# Patient Record
Sex: Female | Born: 1965 | Race: White | Hispanic: No | Marital: Single | Smoking: Never smoker
Health system: Southern US, Community
[De-identification: ages and names within clinical notes are randomized; demographics above are authoritative.]

## PROBLEM LIST (undated history)

## (undated) DIAGNOSIS — G43909 Migraine, unspecified, not intractable, without status migrainosus: Secondary | ICD-10-CM

## (undated) DIAGNOSIS — I10 Essential (primary) hypertension: Secondary | ICD-10-CM

## (undated) HISTORY — PX: APPENDECTOMY: SHX54

## (undated) HISTORY — DX: Essential (primary) hypertension: I10

## (undated) HISTORY — DX: Migraine, unspecified, not intractable, without status migrainosus: G43.909

---

## 2014-04-30 LAB — HM MAMMOGRAPHY: HM MAMMO: NORMAL

## 2014-06-07 DIAGNOSIS — D229 Melanocytic nevi, unspecified: Secondary | ICD-10-CM | POA: Insufficient documentation

## 2014-06-07 HISTORY — DX: Melanocytic nevi, unspecified: D22.9

## 2014-08-05 LAB — LIPID PANEL
Cholesterol: 194 mg/dL (ref 0–200)
HDL: 45 mg/dL (ref 35–70)
LDL Cholesterol: 124 mg/dL
Triglycerides: 125 mg/dL (ref 40–160)

## 2014-08-05 LAB — CBC AND DIFFERENTIAL: Hemoglobin: 14 g/dL (ref 12.0–16.0)

## 2014-08-05 LAB — TSH: TSH: 1.6 u[IU]/mL (ref <=5.90)

## 2015-02-15 ENCOUNTER — Other Ambulatory Visit: Payer: Self-pay | Admitting: Internal Medicine

## 2015-02-15 ENCOUNTER — Encounter: Payer: Self-pay | Admitting: Internal Medicine

## 2015-02-15 ENCOUNTER — Ambulatory Visit (INDEPENDENT_AMBULATORY_CARE_PROVIDER_SITE_OTHER): Payer: Commercial Managed Care - PPO | Admitting: Internal Medicine

## 2015-02-15 VITALS — BP 118/64 | HR 80 | Ht 63.0 in | Wt 136.0 lb

## 2015-02-15 DIAGNOSIS — J302 Other seasonal allergic rhinitis: Secondary | ICD-10-CM

## 2015-02-15 DIAGNOSIS — D485 Neoplasm of uncertain behavior of skin: Secondary | ICD-10-CM

## 2015-02-15 DIAGNOSIS — G43009 Migraine without aura, not intractable, without status migrainosus: Secondary | ICD-10-CM | POA: Insufficient documentation

## 2015-02-15 DIAGNOSIS — I10 Essential (primary) hypertension: Secondary | ICD-10-CM | POA: Insufficient documentation

## 2015-02-15 DIAGNOSIS — R0981 Nasal congestion: Secondary | ICD-10-CM | POA: Diagnosis not present

## 2015-02-15 DIAGNOSIS — E785 Hyperlipidemia, unspecified: Secondary | ICD-10-CM | POA: Insufficient documentation

## 2015-02-15 DIAGNOSIS — E782 Mixed hyperlipidemia: Secondary | ICD-10-CM | POA: Insufficient documentation

## 2015-02-15 DIAGNOSIS — J3089 Other allergic rhinitis: Secondary | ICD-10-CM | POA: Insufficient documentation

## 2015-02-15 DIAGNOSIS — L03031 Cellulitis of right toe: Secondary | ICD-10-CM | POA: Diagnosis not present

## 2015-02-15 HISTORY — DX: Neoplasm of uncertain behavior of skin: D48.5

## 2015-02-15 MED ORDER — FLUTICASONE PROPIONATE 50 MCG/ACT NA SUSP: 2.0000 | Freq: Every day | NASAL | Status: AC

## 2015-02-15 MED ORDER — AMOXICILLIN-POT CLAVULANATE 875-125 MG PO TABS: 1.0000 | ORAL_TABLET | Freq: Two times a day (BID) | ORAL | Status: DC

## 2015-02-15 NOTE — Progress Notes (Signed)
Date:  02/15/2015   Name:  Brenda Keith   DOB:  [birth year]   MRN:  [id number]   Chief Complaint: No chief complaint on file. Other This is a new (pain on the lateral right great toe nail) problem. The current episode started in the past 7 days (but had had trouble since last year when trauma caused the nail to come off.). The problem occurs constantly. The problem has been unchanged. Associated symptoms include congestion. Pertinent negatives include no chest pain, coughing, fatigue, headaches, sore throat or swollen glands. The symptoms are aggravated by walking. Treatments tried: soaking and topical antibiotics.  Sinus Problem This is a recurrent problem. The current episode started 1 to 4 weeks ago. The problem has been waxing and waning since onset. There has been no fever. Associated symptoms include congestion, sinus pressure and sneezing. Pertinent negatives include no coughing, ear pain, headaches, hoarse voice, shortness of breath, sore throat or swollen glands. Past treatments include oral decongestants (and antihistamines; used flonase in the past). The treatment provided mild relief.  She denies any new exposure to possible allergens.   Review of Systems:  Review of Systems  Constitutional: Negative for activity change, appetite change and fatigue.  HENT: Positive for congestion, postnasal drip, sinus pressure and sneezing. Negative for ear pain, hoarse voice, sore throat, trouble swallowing and voice change.   Eyes: Negative for visual disturbance.  Respiratory: Negative for cough and shortness of breath.   Cardiovascular: Negative for chest pain.  Musculoskeletal:       Toe pain on right great toe.  Neurological: Negative for headaches.    Patient Active Problem List   Diagnosis Date Noted  . Familial multiple lipoprotein-type hyperlipidemia 02/15/2015  . Atypical migraine 02/15/2015  . Essential (primary) hypertension 02/15/2015  . Neoplasm of uncertain behavior of skin  02/15/2015  . Allergic rhinitis, seasonal 02/15/2015    Prior to Admission medications   Medication Sig Start Date End Date Taking? Authorizing Provider  clindamycin (CLINDAGEL) 1 % gel CLINDAMYCIN PHOSPHATE, 1% (External Gel) - Historical Medication  (1 %) Active   Yes Historical Provider, MD  fluorouracil (EFUDEX) 5 % cream FLUOROURACIL, 5% (External Cream) - Historical Medication  (5 %) Active   Yes Historical Provider, MD  losartan (COZAAR) 50 MG tablet Take 1 tablet by mouth daily. 06/07/14  Yes Historical Provider, MD  norgestrel-ethinyl estradiol (CRYSELLE-28) 0.3-30 MG-MCG tablet Take 1 tablet by mouth daily.   Yes Historical Provider, MD  zolmitriptan (ZOMIG) 5 MG tablet Take 1 tablet by mouth as needed. 06/02/14  Yes Historical Provider, MD    Allergies  Allergen Reactions  . Ace Inhibitors Cough    Past Surgical History  Procedure Laterality Date  . Appendectomy      History  Substance Use Topics  . Smoking status: Never Smoker   . Smokeless tobacco: Not on file  . Alcohol Use: 1.2 oz/week    2 Standard drinks or equivalent per week     Medication list has been reviewed and updated.  Physical Examination:  Physical Exam  Constitutional: She appears well-developed and well-nourished. No distress.  HENT:  Right Ear: Tympanic membrane and ear canal normal.  Left Ear: Tympanic membrane and ear canal normal.  Nose: Nose normal. Right sinus exhibits no maxillary sinus tenderness. Left sinus exhibits no maxillary sinus tenderness.  Mouth/Throat: Uvula is midline and oropharynx is clear and moist.  Neck: Neck supple. No tracheal tenderness present. Carotid bruit is not present.  Cardiovascular: Normal rate, regular rhythm and  S1 normal.   Pulmonary/Chest: Breath sounds normal. She has no wheezes.  Musculoskeletal:  Ingrown toenail on lateral right great toe.  Tender, warm, red and swollen.  No drainage noted.    BP 118/64 mmHg  Pulse 80  Ht 5\' 3"  (1.6 m)  Wt  136 lb (61.689 kg)  BMI 24.10 kg/m2  Assessment and Plan: 1. Sinus congestion Continue sudafed and allegra if needed Begin flonase daily - fluticasone (FLONASE) 50 MCG/ACT nasal spray; Place 2 sprays into both nostrils daily.  Dispense: 16 g; Refill: 6  2. Paronychia of great toe, right Continue soaks Avoid pressure with open shoes until resolved - amoxicillin-clavulanate (AUGMENTIN) 875-125 MG per tablet; Take 1 tablet by mouth 2 (two) times daily.  Dispense: 20 tablet; Refill: 0   Halina Maidens, MD [facility]  02/15/2015

## 2015-06-02 ENCOUNTER — Other Ambulatory Visit: Payer: Self-pay | Admitting: Internal Medicine

## 2015-09-21 ENCOUNTER — Encounter: Payer: Self-pay | Admitting: Internal Medicine

## 2015-09-21 ENCOUNTER — Ambulatory Visit (INDEPENDENT_AMBULATORY_CARE_PROVIDER_SITE_OTHER): Payer: Commercial Managed Care - PPO | Admitting: Internal Medicine

## 2015-09-21 VITALS — BP 110/60 | HR 72 | Ht 63.0 in | Wt 133.0 lb

## 2015-09-21 DIAGNOSIS — I10 Essential (primary) hypertension: Secondary | ICD-10-CM | POA: Diagnosis not present

## 2015-09-21 DIAGNOSIS — G43009 Migraine without aura, not intractable, without status migrainosus: Secondary | ICD-10-CM

## 2015-09-21 DIAGNOSIS — D485 Neoplasm of uncertain behavior of skin: Secondary | ICD-10-CM

## 2015-09-21 DIAGNOSIS — G43809 Other migraine, not intractable, without status migrainosus: Secondary | ICD-10-CM | POA: Diagnosis not present

## 2015-09-21 DIAGNOSIS — E785 Hyperlipidemia, unspecified: Secondary | ICD-10-CM | POA: Diagnosis not present

## 2015-09-21 DIAGNOSIS — Z0000 Encounter for general adult medical examination without abnormal findings: Secondary | ICD-10-CM | POA: Diagnosis not present

## 2015-09-21 MED ORDER — ZOLMITRIPTAN 5 MG PO TABS: 5.0000 mg | ORAL_TABLET | ORAL | Status: DC | PRN

## 2015-09-21 NOTE — Patient Instructions (Signed)
Breast Self-Awareness Practicing breast self-awareness may pick up problems early, prevent significant medical complications, and possibly save your life. By practicing breast self-awareness, you can become familiar with how your breasts look and feel and if your breasts are changing. This allows you to notice changes early. It can also offer you some reassurance that your breast health is good. One way to learn what is normal for your breasts and whether your breasts are changing is to do a breast self-exam. If you find a lump or something that was not present in the past, it is best to contact your caregiver right away. Other findings that should be evaluated by your caregiver include nipple discharge, especially if it is bloody; skin changes or reddening; areas where the skin seems to be pulled in (retracted); or new lumps and bumps. Breast pain is seldom associated with cancer (malignancy), but should also be evaluated by a caregiver. HOW TO PERFORM A BREAST SELF-EXAM The best time to examine your breasts is 5-7 days after your menstrual period is over. During menstruation, the breasts are lumpier, and it may be more difficult to pick up changes. If you do not menstruate, have reached menopause, or had your uterus removed (hysterectomy), you should examine your breasts at regular intervals, such as monthly. If you are breastfeeding, examine your breasts after a feeding or after using a breast pump. Breast implants do not decrease the risk for lumps or tumors, so continue to perform breast self-exams as recommended. Talk to your caregiver about how to determine the difference between the implant and breast tissue. Also, talk about the amount of pressure you should use during the exam. Over time, you will become more familiar with the variations of your breasts and more comfortable with the exam. A breast self-exam requires you to remove all your clothes above the waist. 1. Look at your breasts and nipples.  Stand in front of a mirror in a room with good lighting. With your hands on your hips, push your hands firmly downward. Look for a difference in shape, contour, and size from one breast to the other (asymmetry). Asymmetry includes puckers, dips, or bumps. Also, look for skin changes, such as reddened or scaly areas on the breasts. Look for nipple changes, such as discharge, dimpling, repositioning, or redness. 2. Carefully feel your breasts. This is best done either in the shower or tub while using soapy water or when flat on your back. Place the arm (on the side of the breast you are examining) above your head. Use the pads (not the fingertips) of your three middle fingers on your opposite hand to feel your breasts. Start in the underarm area and use  inch (2 cm) overlapping circles to feel your breast. Use 3 different levels of pressure (light, medium, and firm pressure) at each circle before moving to the next circle. The light pressure is needed to feel the tissue closest to the skin. The medium pressure will help to feel breast tissue a little deeper, while the firm pressure is needed to feel the tissue close to the ribs. Continue the overlapping circles, moving downward over the breast until you feel your ribs below your breast. Then, move one finger-width towards the center of the body. Continue to use the  inch (2 cm) overlapping circles to feel your breast as you move slowly up toward the collar bone (clavicle) near the base of the neck. Continue the up and down exam using all 3 pressures until you reach the   middle of the chest. Do this with each breast, carefully feeling for lumps or changes. 3.  Keep a written record with breast changes or normal findings for each breast. By writing this information down, you do not need to depend only on memory for size, tenderness, or location. Write down where you are in your menstrual cycle, if you are still menstruating. Breast tissue can have some lumps or  thick tissue. However, see your caregiver if you find anything that concerns you.  SEEK MEDICAL CARE IF:  You see a change in shape, contour, or size of your breasts or nipples.   You see skin changes, such as reddened or scaly areas on the breasts or nipples.   You have an unusual discharge from your nipples.   You feel a new lump or unusually thick areas.    This information is not intended to replace advice given to you by your health care provider. Make sure you discuss any questions you have with your health care provider.   Document Released: 07/17/2005 Document Revised: 07/03/2012 Document Reviewed: 11/01/2011 Elsevier Interactive Patient Education 2016 [employer]  

## 2015-09-21 NOTE — Progress Notes (Signed)
Date:  09/21/2015   Name:  Brenda Keith   DOB:  03/04/1966   MRN:  [id number]   Chief Complaint: Annual Exam Brenda Keith is a 50 y.o. female who presents today for her Complete Annual Exam. She feels well. She reports exercising regularly. She is currently training for a half marathon. She reports she is sleeping well. She sees GYN for Pap and mammogram.  She reports having shingles last fall - a mild case but wants to get the vaccine as soon as she turns 50.  Hypertension This is a chronic problem. The current episode started more than 1 year ago. The problem is unchanged. The problem is controlled. Associated symptoms include headaches (rarely). Pertinent negatives include no chest pain, palpitations or shortness of breath. There are no associated agents to hypertension.  Migraine  This is a chronic problem. The problem occurs seasonly (about 3-4 times per year). The pain quality is similar to prior headaches. The quality of the pain is described as aching. Pertinent negatives include no abdominal pain, coughing, dizziness, fever, hearing loss, tinnitus or vomiting. She has tried triptans for the symptoms. The treatment provided significant relief. Her past medical history is significant for hypertension.    Review of Systems  Constitutional: Negative for fever, chills and fatigue.  HENT: Negative for congestion, hearing loss, tinnitus, trouble swallowing and voice change.   Eyes: Negative for visual disturbance.  Respiratory: Negative for cough, chest tightness, shortness of breath and wheezing.   Cardiovascular: Negative for chest pain, palpitations and leg swelling.  Gastrointestinal: Negative for vomiting, abdominal pain, diarrhea and constipation.  Endocrine: Negative for polydipsia and polyuria.  Genitourinary: Negative for dysuria, frequency, vaginal bleeding, vaginal discharge and genital sores.  Musculoskeletal: Negative for joint swelling, arthralgias and gait problem.  Skin:  Negative for color change and rash.  Neurological: Positive for headaches (rarely). Negative for dizziness, tremors and light-headedness.  Hematological: Negative for adenopathy. Does not bruise/bleed easily.  Psychiatric/Behavioral: Negative for sleep disturbance and dysphoric mood. The patient is not nervous/anxious.     Patient Active Problem List   Diagnosis Date Noted  . Familial multiple lipoprotein-type hyperlipidemia 02/15/2015  . Atypical migraine 02/15/2015  . Essential (primary) hypertension 02/15/2015  . Neoplasm of uncertain behavior of skin 02/15/2015  . Allergic rhinitis, seasonal 02/15/2015  . Sinus congestion 02/15/2015  . Atypical nevus 06/07/2014    Prior to Admission medications   Medication Sig Start Date End Date Taking? Authorizing Provider  clindamycin (CLINDAGEL) 1 % gel CLINDAMYCIN PHOSPHATE, 1% (External Gel) - Historical Medication  (1 %) Active   Yes Historical Provider, MD  fluorouracil (EFUDEX) 5 % cream FLUOROURACIL, 5% (External Cream) - Historical Medication  (5 %) Active   Yes Historical Provider, MD  fluticasone (FLONASE) 50 MCG/ACT nasal spray Place 2 sprays into both nostrils daily. 02/15/15  Yes Brenda Hess, MD  losartan (COZAAR) 50 MG tablet Take 1 tablet by mouth  daily 06/02/15  Yes Brenda Hess, MD  norgestrel-ethinyl estradiol (CRYSELLE-28) 0.3-30 MG-MCG tablet Take 1 tablet by mouth daily.   Yes Historical Provider, MD  zolmitriptan (ZOMIG) 5 MG tablet Take 1 tablet by mouth as needed. 06/02/14  Yes Historical Provider, MD    Allergies  Allergen Reactions  . Ace Inhibitors Cough    Past Surgical History  Procedure Laterality Date  . Appendectomy      Social History  Substance Use Topics  . Smoking status: Never Smoker   . Smokeless tobacco: None  . Alcohol  Use: 1.2 oz/week    2 Standard drinks or equivalent per week     Comment: rarely    Medication list has been reviewed and updated.  Physical Exam    Constitutional: She is oriented to person, place, and time. She appears well-developed and well-nourished. No distress.  HENT:  Head: Normocephalic and atraumatic.  Right Ear: Tympanic membrane and ear canal normal.  Left Ear: Tympanic membrane and ear canal normal.  Nose: Right sinus exhibits no maxillary sinus tenderness. Left sinus exhibits no maxillary sinus tenderness.  Mouth/Throat: Uvula is midline and oropharynx is clear and moist.  Eyes: Conjunctivae and EOM are normal. Right eye exhibits no discharge. Left eye exhibits no discharge. No scleral icterus.  Neck: Normal range of motion. Carotid bruit is not present. No erythema present. No thyromegaly present.  Cardiovascular: Normal rate, regular rhythm, normal heart sounds and normal pulses.   Pulmonary/Chest: Effort normal. No respiratory distress. She has no wheezes.  Abdominal: Soft. Bowel sounds are normal. There is no hepatosplenomegaly. There is no tenderness. There is no CVA tenderness.  Musculoskeletal: Normal range of motion.  Lymphadenopathy:    She has no cervical adenopathy.    She has no axillary adenopathy.  Neurological: She is alert and oriented to person, place, and time. She has normal reflexes. No cranial nerve deficit or sensory deficit.  Skin: Skin is warm, dry and intact. No rash noted.  Psychiatric: She has a normal mood and affect. Her speech is normal and behavior is normal. Thought content normal.  Nursing note and vitals reviewed.   BP 110/60 mmHg  Pulse 72  Ht 5\' 3"  (1.6 m)  Wt 133 lb (60.328 kg)  BMI 23.57 kg/m2  Assessment and Plan: 1. Annual physical exam Continue Pap and mammogram through [facility] for colonoscopy next year Obtain Zostavax after age 73  2. Essential (primary) hypertension controlled - CBC with Differential/Platelet - Comprehensive metabolic panel - TSH  3. Atypical migraine Stable - decreasing some in frequency - zolmitriptan (ZOMIG) 5 MG tablet; Take 1 tablet (5 mg  total) by mouth as needed.  Dispense: 6 tablet; Refill: 5  4. Neoplasm of uncertain behavior of skin Followed by Dr. Binnie Keith [facility]  5. Hyperlipidemia Will advise is medication is needed - Lipid panel   Brenda Maidens, MD [facility]  09/21/2015

## 2015-09-22 ENCOUNTER — Telehealth: Payer: Self-pay

## 2015-09-22 LAB — COMPREHENSIVE METABOLIC PANEL
ALBUMIN: 4.3 g/dL (ref 3.5–5.5)
ALK PHOS: 39 IU/L (ref 39–117)
ALT: 11 IU/L (ref 0–32)
AST: 15 IU/L (ref 0–40)
Albumin/Globulin Ratio: 1.9 (ref 1.1–2.5)
BUN / CREAT RATIO: 12 (ref 9–23)
BUN: 8 mg/dL (ref 6–24)
Bilirubin Total: 0.5 mg/dL (ref 0.0–1.2)
CO2: 22 mmol/L (ref 18–29)
CREATININE: 0.69 mg/dL (ref 0.57–1.00)
Calcium: 8.9 mg/dL (ref 8.7–10.2)
Chloride: 102 mmol/L (ref 96–106)
GFR calc Af Amer: 118 mL/min/{1.73_m2} (ref >59)
GFR calc non Af Amer: 103 mL/min/{1.73_m2} (ref >59)
GLUCOSE: 89 mg/dL (ref 65–99)
Globulin, Total: 2.3 g/dL (ref 1.5–4.5)
Potassium: 4.2 mmol/L (ref 3.5–5.2)
Sodium: 140 mmol/L (ref 134–144)
Total Protein: 6.6 g/dL (ref 6.0–8.5)

## 2015-09-22 LAB — CBC WITH DIFFERENTIAL/PLATELET
BASOS ABS: 0 10*3/uL (ref 0.0–0.2)
Basos: 1 %
EOS (ABSOLUTE): 0.2 10*3/uL (ref 0.0–0.4)
Eos: 2 %
HEMOGLOBIN: 12.9 g/dL (ref 11.1–15.9)
Hematocrit: 38.2 % (ref 34.0–46.6)
IMMATURE GRANS (ABS): 0 10*3/uL (ref 0.0–0.1)
Immature Granulocytes: 0 %
LYMPHS ABS: 2.3 10*3/uL (ref 0.7–3.1)
LYMPHS: 36 %
MCH: 31.3 pg (ref 26.6–33.0)
MCHC: 33.8 g/dL (ref 31.5–35.7)
MCV: 93 fL (ref 79–97)
Monocytes Absolute: 0.3 10*3/uL (ref 0.1–0.9)
Monocytes: 5 %
Neutrophils Absolute: 3.7 10*3/uL (ref 1.4–7.0)
Neutrophils: 56 %
PLATELETS: 246 10*3/uL (ref 150–379)
RBC: 4.12 x10E6/uL (ref 3.77–5.28)
RDW: 13.4 % (ref 12.3–15.4)
WBC: 6.5 10*3/uL (ref 3.4–10.8)

## 2015-09-22 LAB — LIPID PANEL
CHOLESTEROL TOTAL: 191 mg/dL (ref 100–199)
Chol/HDL Ratio: 4.7 ratio units — ABNORMAL HIGH (ref 0.0–4.4)
HDL: 41 mg/dL (ref >39)
LDL CALC: 126 mg/dL — AB (ref 0–99)
TRIGLYCERIDES: 119 mg/dL (ref 0–149)
VLDL CHOLESTEROL CAL: 24 mg/dL (ref 5–40)

## 2015-09-22 LAB — TSH: TSH: 1.44 u[IU]/mL (ref 0.450–4.500)

## 2015-09-22 NOTE — Telephone Encounter (Signed)
Spoke with patient. Patient advised of all results and verbalized understanding. Will call back with any future questions or concerns. MAH  

## 2015-09-22 NOTE — Telephone Encounter (Signed)
-----   Message from Glean Hess, MD sent at 09/22/2015  7:50 AM EST ----- Labs are normal.  Continue same medication.

## 2015-09-27 ENCOUNTER — Encounter: Payer: Self-pay | Admitting: Internal Medicine

## 2015-10-30 HISTORY — PX: OTHER SURGICAL HISTORY: SHX169

## 2015-11-21 LAB — HM PAP SMEAR: HM Pap smear: NORMAL

## 2015-12-01 DIAGNOSIS — C4491 Basal cell carcinoma of skin, unspecified: Secondary | ICD-10-CM | POA: Insufficient documentation

## 2016-04-25 ENCOUNTER — Other Ambulatory Visit: Payer: Self-pay | Admitting: Internal Medicine

## 2016-04-25 NOTE — Telephone Encounter (Signed)
pts coming in on 2/23 for cpe.

## 2016-09-22 ENCOUNTER — Encounter: Payer: Self-pay | Admitting: Internal Medicine

## 2016-09-22 ENCOUNTER — Ambulatory Visit (INDEPENDENT_AMBULATORY_CARE_PROVIDER_SITE_OTHER): Payer: Commercial Managed Care - PPO | Admitting: Internal Medicine

## 2016-09-22 VITALS — BP 122/68 | HR 64 | Ht 63.0 in | Wt 132.0 lb

## 2016-09-22 DIAGNOSIS — Z1211 Encounter for screening for malignant neoplasm of colon: Secondary | ICD-10-CM | POA: Diagnosis not present

## 2016-09-22 DIAGNOSIS — G43009 Migraine without aura, not intractable, without status migrainosus: Secondary | ICD-10-CM | POA: Diagnosis not present

## 2016-09-22 DIAGNOSIS — I10 Essential (primary) hypertension: Secondary | ICD-10-CM

## 2016-09-22 DIAGNOSIS — E782 Mixed hyperlipidemia: Secondary | ICD-10-CM

## 2016-09-22 DIAGNOSIS — Z0000 Encounter for general adult medical examination without abnormal findings: Secondary | ICD-10-CM

## 2016-09-22 LAB — POCT URINALYSIS DIPSTICK
Bilirubin, UA: NEGATIVE
GLUCOSE UA: NEGATIVE
Nitrite, UA: NEGATIVE
SPEC GRAV UA: 1.015
UROBILINOGEN UA: 0.2
pH, UA: 6.5

## 2016-09-22 MED ORDER — LOSARTAN POTASSIUM 50 MG PO TABS: 50.0000 mg | ORAL_TABLET | Freq: Every day | ORAL | 3 refills | Status: DC

## 2016-09-22 NOTE — Patient Instructions (Signed)
Breast Self-Awareness Introduction Breast self-awareness means being familiar with how your breasts look and feel. It involves checking your breasts regularly and reporting any changes to your health care provider. Practicing breast self-awareness is important. A change in your breasts can be a sign of a serious medical problem. Being familiar with how your breasts look and feel allows you to find any problems early, when treatment is more likely to be successful. All women should practice breast self-awareness, including women who have had breast implants. How to do a breast self-exam One way to learn what is normal for your breasts and whether your breasts are changing is to do a breast self-exam. To do a breast self-exam: Look for Changes  1. Remove all the clothing above your waist. 2. Stand in front of a mirror in a room with good lighting. 3. Put your hands on your hips. 4. Push your hands firmly downward. 5. Compare your breasts in the mirror. Look for differences between them (asymmetry), such as:  Differences in shape.  Differences in size.  Puckers, dips, and bumps in one breast and not the other. 6. Look at each breast for changes in your skin, such as:  Redness.  Scaly areas. 7. Look for changes in your nipples, such as:  Discharge.  Bleeding.  Dimpling.  Redness.  A change in position. Feel for Changes  Carefully feel your breasts for lumps and changes. It is best to do this while lying on your back on the floor and again while sitting or standing in the shower or tub with soapy water on your skin. Feel each breast in the following way:  Place the arm on the side of the breast you are examining above your head.  Feel your breast with the other hand.  Start in the nipple area and make  inch (2 cm) overlapping circles to feel your breast. Use the pads of your three middle fingers to do this. Apply light pressure, then medium pressure, then firm pressure. The light  pressure will allow you to feel the tissue closest to the skin. The medium pressure will allow you to feel the tissue that is a little deeper. The firm pressure will allow you to feel the tissue close to the ribs.  Continue the overlapping circles, moving downward over the breast until you feel your ribs below your breast.  Move one finger-width toward the center of the body. Continue to use the  inch (2 cm) overlapping circles to feel your breast as you move slowly up toward your collarbone.  Continue the up and down exam using all three pressures until you reach your armpit. Write Down What You Find  Write down what is normal for each breast and any changes that you find. Keep a written record with breast changes or normal findings for each breast. By writing this information down, you do not need to depend only on memory for size, tenderness, or location. Write down where you are in your menstrual cycle, if you are still menstruating. If you are having trouble noticing differences in your breasts, do not get discouraged. With time you will become more familiar with the variations in your breasts and more comfortable with the exam. How often should I examine my breasts? Examine your breasts every month. If you are breastfeeding, the best time to examine your breasts is after a feeding or after using a breast pump. If you menstruate, the best time to examine your breasts is 5-7 days after your  period is over. During your period, your breasts are lumpier, and it may be more difficult to notice changes. When should I see my health care provider? See your health care provider if you notice:  A change in shape or size of your breasts or nipples.  A change in the skin of your breast or nipples, such as a reddened or scaly area.  Unusual discharge from your nipples.  A lump or thick area that was not there before.  Pain in your breasts.  Anything that concerns you. This information is not  intended to replace advice given to you by your health care provider. Make sure you discuss any questions you have with your health care provider. Document Released: 07/17/2005 Document Revised: 12/23/2015 Document Reviewed: 06/06/2015  2017 Elsevier

## 2016-09-22 NOTE — Progress Notes (Signed)
Date:  09/22/2016   Name:  Brenda Keith   DOB:  1965-11-21   MRN:  [id number]   Chief Complaint: Annual Exam (pt refusing breast exam. seeing gyno in april.) Brenda Keith is a 51 y.o. female who presents today for her Complete Annual Exam. She feels well. She reports exercising regularly. She has some knee discomfort from persistent running but does not like many other types of exercise. She reports she is sleeping well.  She sees GYN for breast exam and Pap.  Hypertension  This is a chronic problem. The current episode started more than 1 year ago. The problem is unchanged. The problem is controlled. Pertinent negatives include no chest pain, headaches, palpitations or shortness of breath. Past treatments include angiotensin blockers. The current treatment provides significant improvement.  Hyperlipidemia  This is a chronic problem. The problem is controlled. Recent lipid tests were reviewed and are variable. Pertinent negatives include no chest pain or shortness of breath.  Migraine   This is a recurrent problem. The problem occurs seasonly (2-3 times per year). The pain is located in the right unilateral and retro-orbital region. The pain quality is similar to prior headaches. Pertinent negatives include no abdominal pain, coughing, dizziness, fever, hearing loss, tinnitus or vomiting. Her past medical history is significant for hypertension.    Review of Systems  Constitutional: Negative for chills, fatigue and fever.  HENT: Negative for congestion, hearing loss, tinnitus, trouble swallowing and voice change.   Eyes: Negative for visual disturbance.  Respiratory: Negative for cough, chest tightness, shortness of breath and wheezing.   Cardiovascular: Negative for chest pain, palpitations and leg swelling.  Gastrointestinal: Negative for abdominal pain, constipation, diarrhea and vomiting.  Endocrine: Negative for polydipsia and polyuria.  Genitourinary: Negative for dysuria, frequency,  genital sores, vaginal bleeding and vaginal discharge.  Musculoskeletal: Positive for arthralgias (mild right knee discomfort from overuse (running)). Negative for gait problem and joint swelling.  Skin: Negative for color change and rash.  Neurological: Negative for dizziness, tremors, light-headedness and headaches.  Hematological: Negative for adenopathy. Does not bruise/bleed easily.  Psychiatric/Behavioral: Negative for dysphoric mood and sleep disturbance. The patient is not nervous/anxious.     Patient Active Problem List   Diagnosis Date Noted  . Hyperlipidemia 02/15/2015  . Atypical migraine 02/15/2015  . Essential (primary) hypertension 02/15/2015  . Neoplasm of uncertain behavior of skin 02/15/2015  . Allergic rhinitis, seasonal 02/15/2015  . Sinus congestion 02/15/2015  . Atypical nevus 06/07/2014    Prior to Admission medications   Medication Sig Start Date End Date Taking? Authorizing Provider  clindamycin (CLINDAGEL) 1 % gel CLINDAMYCIN PHOSPHATE, 1% (External Gel) - Historical Medication  (1 %) Active    Historical Provider, MD  fluorouracil (EFUDEX) 5 % cream FLUOROURACIL, 5% (External Cream) - Historical Medication  (5 %) Active    Historical Provider, MD  fluticasone (FLONASE) 50 MCG/ACT nasal spray Place 2 sprays into both nostrils daily. 02/15/15   Glean Hess, MD  losartan (COZAAR) 50 MG tablet TAKE 1 TABLET BY MOUTH  DAILY 04/25/16   Glean Hess, MD  norgestrel-ethinyl estradiol (CRYSELLE-28) 0.3-30 MG-MCG tablet Take 1 tablet by mouth daily.    Historical Provider, MD  zolmitriptan (ZOMIG) 5 MG tablet Take 1 tablet (5 mg total) by mouth as needed. 09/21/15   Glean Hess, MD    Allergies  Allergen Reactions  . Ace Inhibitors Cough    Lisinopril     Past Surgical History:  Procedure Laterality Date  .  APPENDECTOMY    . skin cancer removal Right 10/2015   face/ jaw line    Social History  Substance Use Topics  . Smoking status: Never  Smoker  . Smokeless tobacco: Never Used  . Alcohol use 1.2 oz/week    2 Standard drinks or equivalent per week     Comment: rarely     Medication list has been reviewed and updated.   Physical Exam  Constitutional: She is oriented to person, place, and time. She appears well-developed and well-nourished. No distress.  HENT:  Head: Normocephalic and atraumatic.  Right Ear: Tympanic membrane and ear canal normal.  Left Ear: Tympanic membrane and ear canal normal.  Nose: Right sinus exhibits no maxillary sinus tenderness. Left sinus exhibits no maxillary sinus tenderness.  Mouth/Throat: Uvula is midline and oropharynx is clear and moist.  Eyes: Conjunctivae and EOM are normal. Right eye exhibits no discharge. Left eye exhibits no discharge. No scleral icterus.  Neck: Normal range of motion. Carotid bruit is not present. No erythema present. No thyromegaly present.  Cardiovascular: Normal rate, regular rhythm, normal heart sounds and normal pulses.   Pulmonary/Chest: Effort normal. No respiratory distress. She has no wheezes.  Abdominal: Soft. Bowel sounds are normal. There is no hepatosplenomegaly. There is no tenderness. There is no CVA tenderness.  Musculoskeletal: Normal range of motion.  Lymphadenopathy:    She has no cervical adenopathy.    She has no axillary adenopathy.  Neurological: She is alert and oriented to person, place, and time. She has normal reflexes. No cranial nerve deficit or sensory deficit.  Skin: Skin is warm, dry and intact. No rash noted.  Psychiatric: She has a normal mood and affect. Her speech is normal and behavior is normal. Thought content normal.  Nursing note and vitals reviewed.   BP 122/68   Pulse 64   Ht 5\' 3"  (1.6 m)   Wt 132 lb (59.9 kg)   SpO2 99%   BMI 23.38 kg/m   Assessment and Plan: 1. Annual physical exam Normal exam Continue to see GYN for Pap and Mammogram - POCT urinalysis dipstick  2. Colon cancer screening Will call back  with MD of choice  3. Essential (primary) hypertension controlled - CBC with Differential/Platelet - Comprehensive metabolic panel - TSH  4. Atypical migraine Stable, intermittent and still responsive to imitrex  5. Mixed hyperlipidemia Continue diet and exercise - Lipid panel  Rx to get Shingrix vaccine given.  Meds ordered this encounter  Medications  . losartan (COZAAR) 50 MG tablet    Sig: Take 1 tablet (50 mg total) by mouth daily.    Dispense:  90 tablet    Refill:  [place], MD [facility]  09/22/2016

## 2016-09-23 LAB — COMPREHENSIVE METABOLIC PANEL
ALT: 14 IU/L (ref 0–32)
AST: 16 IU/L (ref 0–40)
Albumin/Globulin Ratio: 2 (ref 1.2–2.2)
Albumin: 4.6 g/dL (ref 3.5–5.5)
Alkaline Phosphatase: 43 IU/L (ref 39–117)
BILIRUBIN TOTAL: 0.6 mg/dL (ref 0.0–1.2)
BUN/Creatinine Ratio: 10 (ref 9–23)
BUN: 7 mg/dL (ref 6–24)
CALCIUM: 9.1 mg/dL (ref 8.7–10.2)
CHLORIDE: 102 mmol/L (ref 96–106)
CO2: 21 mmol/L (ref 18–29)
Creatinine, Ser: 0.73 mg/dL (ref 0.57–1.00)
GFR calc non Af Amer: 96 mL/min/{1.73_m2} (ref >59)
GFR, EST AFRICAN AMERICAN: 111 mL/min/{1.73_m2} (ref >59)
GLUCOSE: 78 mg/dL (ref 65–99)
Globulin, Total: 2.3 g/dL (ref 1.5–4.5)
Potassium: 4.3 mmol/L (ref 3.5–5.2)
Sodium: 142 mmol/L (ref 134–144)
TOTAL PROTEIN: 6.9 g/dL (ref 6.0–8.5)

## 2016-09-23 LAB — CBC WITH DIFFERENTIAL/PLATELET
BASOS ABS: 0 10*3/uL (ref 0.0–0.2)
Basos: 0 %
EOS (ABSOLUTE): 0.1 10*3/uL (ref 0.0–0.4)
Eos: 2 %
Hematocrit: 41.4 % (ref 34.0–46.6)
Hemoglobin: 13.5 g/dL (ref 11.1–15.9)
IMMATURE GRANS (ABS): 0 10*3/uL (ref 0.0–0.1)
IMMATURE GRANULOCYTES: 0 %
Lymphocytes Absolute: 2.3 10*3/uL (ref 0.7–3.1)
Lymphs: 34 %
MCH: 30.7 pg (ref 26.6–33.0)
MCHC: 32.6 g/dL (ref 31.5–35.7)
MCV: 94 fL (ref 79–97)
Monocytes Absolute: 0.3 10*3/uL (ref 0.1–0.9)
Monocytes: 4 %
Neutrophils Absolute: 4.1 10*3/uL (ref 1.4–7.0)
Neutrophils: 60 %
PLATELETS: 252 10*3/uL (ref 150–379)
RBC: 4.4 x10E6/uL (ref 3.77–5.28)
RDW: 13.4 % (ref 12.3–15.4)
WBC: 6.8 10*3/uL (ref 3.4–10.8)

## 2016-09-23 LAB — LIPID PANEL
Chol/HDL Ratio: 4.5 ratio units — ABNORMAL HIGH (ref 0.0–4.4)
Cholesterol, Total: 189 mg/dL (ref 100–199)
HDL: 42 mg/dL (ref >39)
LDL Calculated: 119 mg/dL — ABNORMAL HIGH (ref 0–99)
Triglycerides: 138 mg/dL (ref 0–149)
VLDL CHOLESTEROL CAL: 28 mg/dL (ref 5–40)

## 2016-09-23 LAB — TSH: TSH: 1.65 u[IU]/mL (ref 0.450–4.500)

## 2016-10-13 ENCOUNTER — Encounter: Payer: Self-pay | Admitting: Internal Medicine

## 2016-10-13 ENCOUNTER — Ambulatory Visit (INDEPENDENT_AMBULATORY_CARE_PROVIDER_SITE_OTHER): Payer: Commercial Managed Care - PPO | Admitting: Internal Medicine

## 2016-10-13 ENCOUNTER — Other Ambulatory Visit: Payer: Self-pay | Admitting: Internal Medicine

## 2016-10-13 VITALS — BP 142/84 | HR 68 | Resp 16 | Ht 63.0 in | Wt 132.7 lb

## 2016-10-13 DIAGNOSIS — R350 Frequency of micturition: Secondary | ICD-10-CM | POA: Diagnosis not present

## 2016-10-13 LAB — POCT URINALYSIS DIPSTICK
BILIRUBIN UA: NEGATIVE
GLUCOSE UA: NEGATIVE
Ketones, UA: NEGATIVE
Leukocytes, UA: NEGATIVE
NITRITE UA: NEGATIVE
Protein, UA: NEGATIVE
RBC UA: NEGATIVE
SPEC GRAV UA: 1.01 (ref 1.030–1.035)
UROBILINOGEN UA: 0.2 (ref ?–2.0)
pH, UA: 7 (ref 5.0–8.0)

## 2016-10-13 NOTE — Progress Notes (Signed)
Date:  10/13/2016   Name:  Brenda Keith   DOB:  January 30, 1966   MRN:  [id number]   Chief Complaint: Urinary Tract Infection (Feels full bladder. No Dysuria. Took Nitrofurantoin from [facility] in order to go to [place]. No burning,No nocturia, and no hematuria. Just feels full bladder sitting or anytime there is pressure. Not sexually active and no discharge. )  Had sx of a mild UTI last month and was treated with Macrobid.  Sx did not improve. She has constant fullness and discomfort in the suprapubic area.  Walking and running increase the sensation as well as seat belt or palpation.  Review of Systems  Constitutional: Negative for chills, fatigue, fever and unexpected weight change.  Respiratory: Negative for chest tightness and shortness of breath.   Cardiovascular: Negative for chest pain.  Gastrointestinal: Negative for abdominal distention, blood in stool, diarrhea and vomiting.  Genitourinary: Positive for frequency. Negative for difficulty urinating, dysuria, genital sores, hematuria and vaginal discharge.    Patient Active Problem List   Diagnosis Date Noted  . Hyperlipidemia 02/15/2015  . Atypical migraine 02/15/2015  . Essential (primary) hypertension 02/15/2015  . Neoplasm of uncertain behavior of skin 02/15/2015  . Allergic rhinitis, seasonal 02/15/2015  . Sinus congestion 02/15/2015  . Atypical nevus 06/07/2014    Prior to Admission medications   Medication Sig Start Date End Date Taking? Authorizing Provider  clindamycin (CLINDAGEL) 1 % gel CLINDAMYCIN PHOSPHATE, 1% (External Gel) - Historical Medication  (1 %) Active   Yes Historical Provider, MD  fluorouracil (EFUDEX) 5 % cream FLUOROURACIL, 5% (External Cream) - Historical Medication  (5 %) Active   Yes Historical Provider, MD  fluticasone (FLONASE) 50 MCG/ACT nasal spray Place 2 sprays into both nostrils daily. 02/15/15  Yes Glean Hess, MD  losartan (COZAAR) 50 MG tablet Take 1 tablet (50 mg total) by mouth  daily. 09/22/16  Yes Glean Hess, MD  norgestrel-ethinyl estradiol (CRYSELLE-28) 0.3-30 MG-MCG tablet Take 1 tablet by mouth daily.   Yes Historical Provider, MD  zolmitriptan (ZOMIG) 5 MG tablet Take 1 tablet (5 mg total) by mouth as needed. 09/21/15  Yes Glean Hess, MD    Allergies  Allergen Reactions  . Ace Inhibitors Cough    Lisinopril     Past Surgical History:  Procedure Laterality Date  . APPENDECTOMY    . skin cancer removal Right 10/2015   face/ jaw line    Social History  Substance Use Topics  . Smoking status: Never Smoker  . Smokeless tobacco: Never Used  . Alcohol use 1.2 oz/week    2 Standard drinks or equivalent per week     Comment: rarely     Medication list has been reviewed and updated.   Physical Exam  Constitutional: She is oriented to person, place, and time. She appears well-developed and well-nourished. No distress.  HENT:  Head: Normocephalic and atraumatic.  Cardiovascular: Normal rate, regular rhythm and normal heart sounds.   Pulmonary/Chest: Effort normal and breath sounds normal. No respiratory distress.  Abdominal: Soft. Bowel sounds are normal. There is tenderness in the suprapubic area. There is no rebound, no guarding and no CVA tenderness.  Musculoskeletal: Normal range of motion.  Neurological: She is alert and oriented to person, place, and time.  Skin: Skin is warm and dry. No rash noted.  Psychiatric: She has a normal mood and affect. Her behavior is normal. Thought content normal.  Nursing note and vitals reviewed.   BP (!) 142/84  Pulse 68   Resp 16   Ht 5\' 3"  (1.6 m)   Wt 132 lb 11.2 oz (60.2 kg)   SpO2 98%   BMI 23.51 kg/m   Assessment and Plan: 1. Urinary frequency Negative UA - will send for Cx Trial of Myrbetriq - POCT urinalysis dipstick - Urine culture   No orders of the defined types were placed in this encounter.   Halina Maidens, MD [facility]  10/13/2016

## 2016-10-13 NOTE — Patient Instructions (Signed)
Myrbetriq 25 mg once a day

## 2016-10-17 LAB — URINE CULTURE: ORGANISM ID, BACTERIA: NO GROWTH

## 2016-11-14 ENCOUNTER — Ambulatory Visit
Admission: RE | Admit: 2016-11-14 | Discharge: 2016-11-14 | Disposition: A | Discharge status: Home / Self Care | Payer: Commercial Managed Care - PPO | Source: Ambulatory Visit | Attending: Internal Medicine | Admitting: Internal Medicine

## 2016-11-14 ENCOUNTER — Ambulatory Visit (INDEPENDENT_AMBULATORY_CARE_PROVIDER_SITE_OTHER): Payer: Commercial Managed Care - PPO | Admitting: Internal Medicine

## 2016-11-14 ENCOUNTER — Encounter: Payer: Self-pay | Admitting: Internal Medicine

## 2016-11-14 VITALS — BP 138/78 | HR 74 | Ht 63.0 in | Wt 134.4 lb

## 2016-11-14 DIAGNOSIS — R0602 Shortness of breath: Secondary | ICD-10-CM

## 2016-11-14 DIAGNOSIS — R42 Dizziness and giddiness: Secondary | ICD-10-CM | POA: Diagnosis not present

## 2016-11-14 NOTE — Progress Notes (Signed)
Date:  11/14/2016   Name:  Brenda Keith   DOB:  01-15-1966   MRN:  [id number]   Chief Complaint: Dizziness (X 1 week ago at work- felt intense flash of heat. Felt extremely dizzy, and nausea. Almost passed out. Felt SOB, and felt a burning sensation in chest. After heat wave passed, felt cold and clammy. Didn't feel completey normal til a couple hours after this episode. Yesterday had same episode but heat wave was not as strong. BP yesterday during episode was 142/76 and pulse was 62. SOB was noticed more yesterday than first episode. Only difference was muscle pain in right shoulder and neck. ) Dizziness  This is a new problem. The current episode started in the past 7 days (2 episodes - one 8 days ago and one yesterday). Associated symptoms include congestion, nausea and a sore throat. Pertinent negatives include no abdominal pain, arthralgias, chest pain, chills, diaphoresis, fatigue, fever, headaches, vomiting or weakness. Nothing aggravates the symptoms.  Shortness of Breath  This is a new problem. The current episode started in the past 7 days. Episode frequency: associated with the 2 episodes of dizziness above. Associated symptoms include a sore throat. Pertinent negatives include no abdominal pain, chest pain, fever, headaches, vomiting or wheezing. The patient has no known risk factors for DVT/PE. She has tried nothing for the symptoms. Her past medical history is significant for allergies (has been driving with the car windows open and also running 5 miles over the weekend). There is no history of asthma, CAD or COPD.     Review of Systems  Constitutional: Negative for chills, diaphoresis, fatigue and fever.  HENT: Positive for congestion and sore throat. Negative for hearing loss, sinus pain, sinus pressure and tinnitus.   Eyes: Negative for visual disturbance.  Respiratory: Positive for shortness of breath. Negative for chest tightness and wheezing.   Cardiovascular: Negative for  chest pain and palpitations.  Gastrointestinal: Positive for nausea. Negative for abdominal pain and vomiting.  Musculoskeletal: Negative for arthralgias.  Neurological: Positive for dizziness and light-headedness. Negative for syncope, weakness and headaches.  Psychiatric/Behavioral: Negative for sleep disturbance. The patient is not nervous/anxious.     Patient Active Problem List   Diagnosis Date Noted  . Hyperlipidemia 02/15/2015  . Atypical migraine 02/15/2015  . Essential (primary) hypertension 02/15/2015  . Neoplasm of uncertain behavior of skin 02/15/2015  . Allergic rhinitis, seasonal 02/15/2015  . Sinus congestion 02/15/2015  . Atypical nevus 06/07/2014    Prior to Admission medications   Medication Sig Start Date End Date Taking? Authorizing Provider  clindamycin (CLINDAGEL) 1 % gel CLINDAMYCIN PHOSPHATE, 1% (External Gel) - Historical Medication  (1 %) Active   Yes Historical Provider, MD  fluorouracil (EFUDEX) 5 % cream FLUOROURACIL, 5% (External Cream) - Historical Medication  (5 %) Active   Yes Historical Provider, MD  fluticasone (FLONASE) 50 MCG/ACT nasal spray Place 2 sprays into both nostrils daily. 02/15/15  Yes Glean Hess, MD  losartan (COZAAR) 50 MG tablet Take 1 tablet (50 mg total) by mouth daily. 09/22/16  Yes Glean Hess, MD  norgestrel-ethinyl estradiol (CRYSELLE-28) 0.3-30 MG-MCG tablet Take 1 tablet by mouth daily.   Yes Historical Provider, MD  zolmitriptan (ZOMIG) 5 MG tablet Take 1 tablet (5 mg total) by mouth as needed. 09/21/15  Yes Glean Hess, MD    Allergies  Allergen Reactions  . Ace Inhibitors Cough    Lisinopril     Past Surgical History:  Procedure Laterality Date  .  APPENDECTOMY    . skin cancer removal Right 10/2015   face/ jaw line    Social History  Substance Use Topics  . Smoking status: Never Smoker  . Smokeless tobacco: Never Used  . Alcohol use 1.2 oz/week    2 Standard drinks or equivalent per week      Comment: rarely     Medication list has been reviewed and updated.   Physical Exam  Constitutional: She is oriented to person, place, and time. She appears well-developed. No distress.  HENT:  Head: Normocephalic and atraumatic.  Right Ear: Tympanic membrane and ear canal normal.  Left Ear: Tympanic membrane and ear canal normal.  Nose: Right sinus exhibits no maxillary sinus tenderness and no frontal sinus tenderness. Left sinus exhibits no maxillary sinus tenderness and no frontal sinus tenderness.  Mouth/Throat: No posterior oropharyngeal erythema.  Eyes: Conjunctivae are normal. Pupils are equal, round, and reactive to light. Right eye exhibits nystagmus. Left eye exhibits nystagmus.  Neck: Normal range of motion.  Cardiovascular: Normal rate, regular rhythm and normal heart sounds.   Pulmonary/Chest: Effort normal. No respiratory distress. She has decreased breath sounds (more prominent in both lower lungs). She has no wheezes. She has no rhonchi.  Musculoskeletal: Normal range of motion.  Muscle tension in right trapezius  Neurological: She is alert and oriented to person, place, and time.  Skin: Skin is warm and dry. No rash noted.  Psychiatric: She has a normal mood and affect. Her behavior is normal. Thought content normal.  Nursing note and vitals reviewed.   BP (!) 142/70 (BP Location: Right Arm, Patient Position: Sitting, Cuff Size: Normal)   Pulse 74   Ht 5\' 3"  (1.6 m)   Wt 134 lb 6.4 oz (61 kg)   SpO2 98%   BMI 23.81 kg/m   Assessment and Plan: 1. Vertigo Appears to be of inner ear origin and hopefully self limited  2. Shortness of breath EKG normal Rule out pulmonary disease such as new onset asthma or atypical infection Avoid pollen and allergens for the next few weeks - DG Chest 2 View - EKG 12-Lead   No orders of the defined types were placed in this encounter.   Halina Maidens, MD [facility]  11/14/2016

## 2016-11-20 ENCOUNTER — Encounter: Payer: Self-pay | Admitting: Internal Medicine

## 2016-12-28 ENCOUNTER — Telehealth: Payer: Self-pay

## 2016-12-28 NOTE — Telephone Encounter (Signed)
Pt called stating she was told by Dr. Army Melia to get the name of place she would like colonoscopy done so referral can be sent. The place she prefers:  Dr. Ottis Stain at [facility]  [address], [place], [place] [address] Phone: [phone]  Pt would like referral sent to this location.

## 2016-12-31 ENCOUNTER — Other Ambulatory Visit: Payer: Self-pay | Admitting: Internal Medicine

## 2016-12-31 DIAGNOSIS — Z1211 Encounter for screening for malignant neoplasm of colon: Secondary | ICD-10-CM

## 2017-02-14 DIAGNOSIS — Z1211 Encounter for screening for malignant neoplasm of colon: Secondary | ICD-10-CM | POA: Insufficient documentation

## 2017-02-19 ENCOUNTER — Encounter: Payer: Self-pay | Admitting: Internal Medicine

## 2017-02-19 DIAGNOSIS — D126 Benign neoplasm of colon, unspecified: Secondary | ICD-10-CM | POA: Insufficient documentation

## 2017-02-19 HISTORY — PX: COLONOSCOPY: SHX174

## 2017-04-26 IMAGING — CR DG CHEST 2V
2 series · 2 of 2 positions shown · non-contrast
Comparison: None.

CLINICAL DATA: Shortness of breath over the last 10 days.

EXAM:
CHEST  2 VIEW

[chest pa]
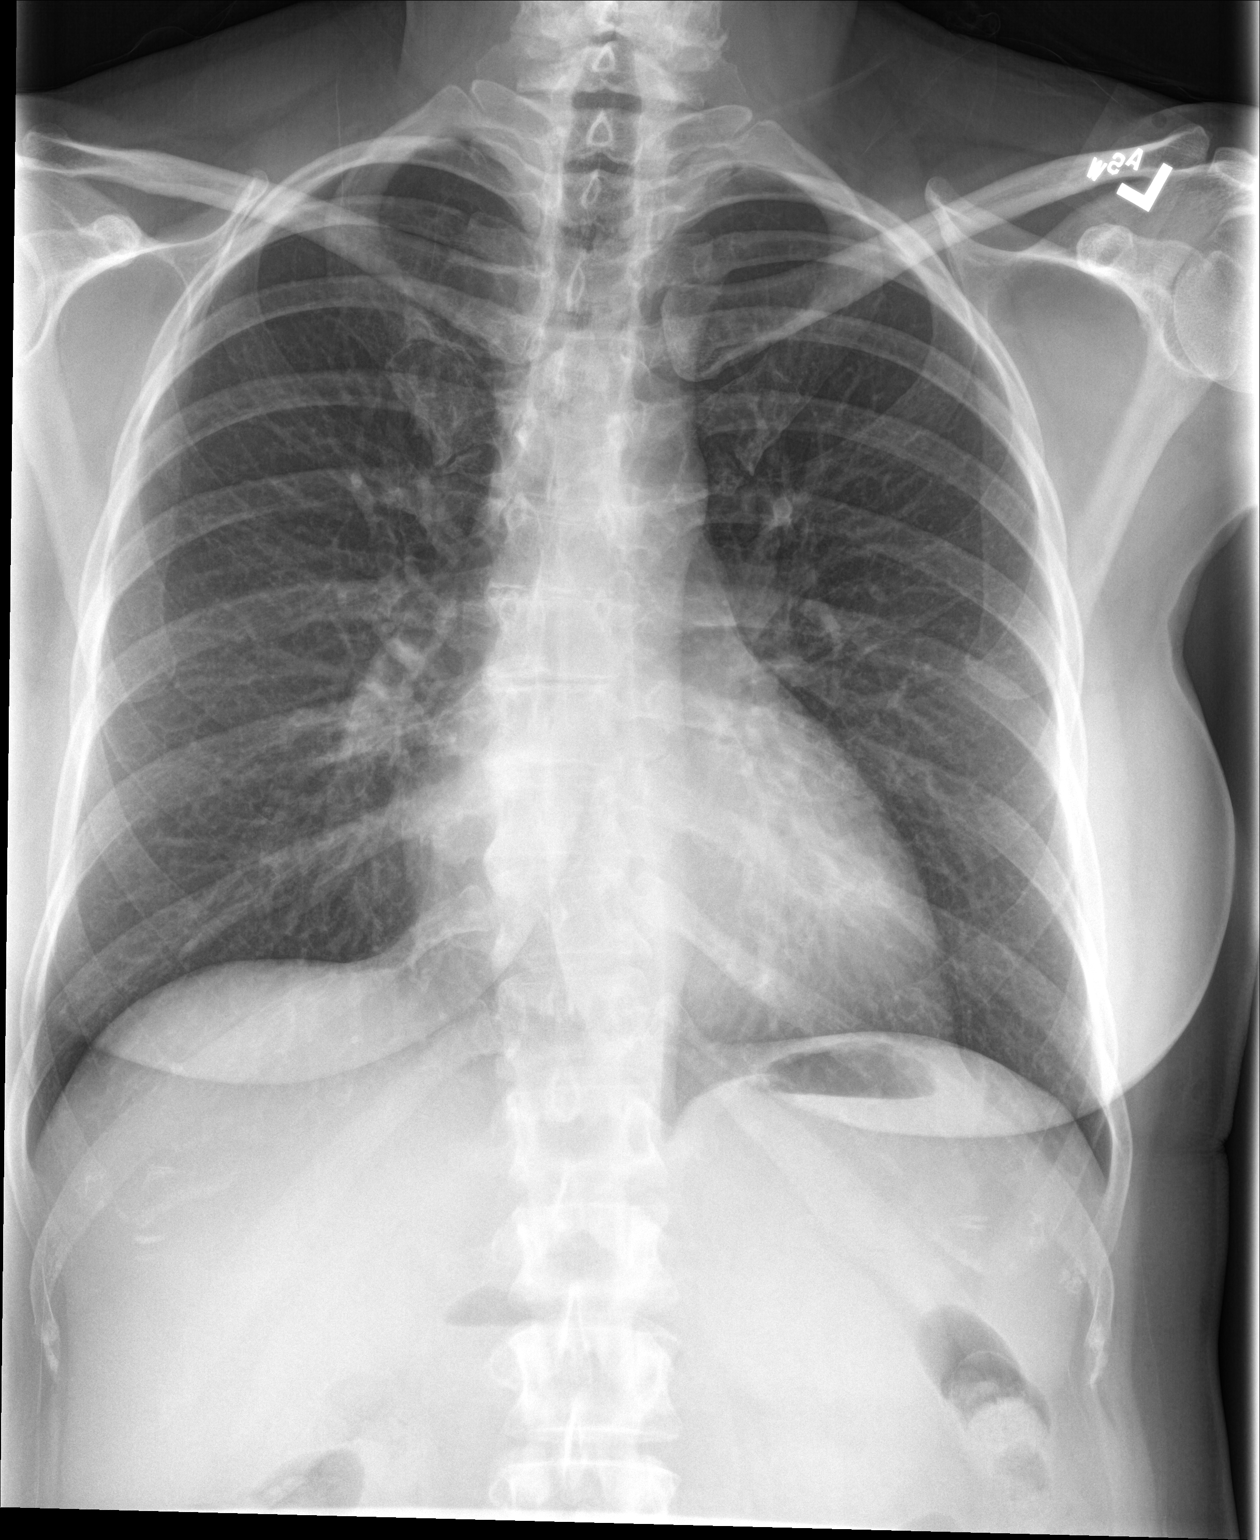

[chest lat]
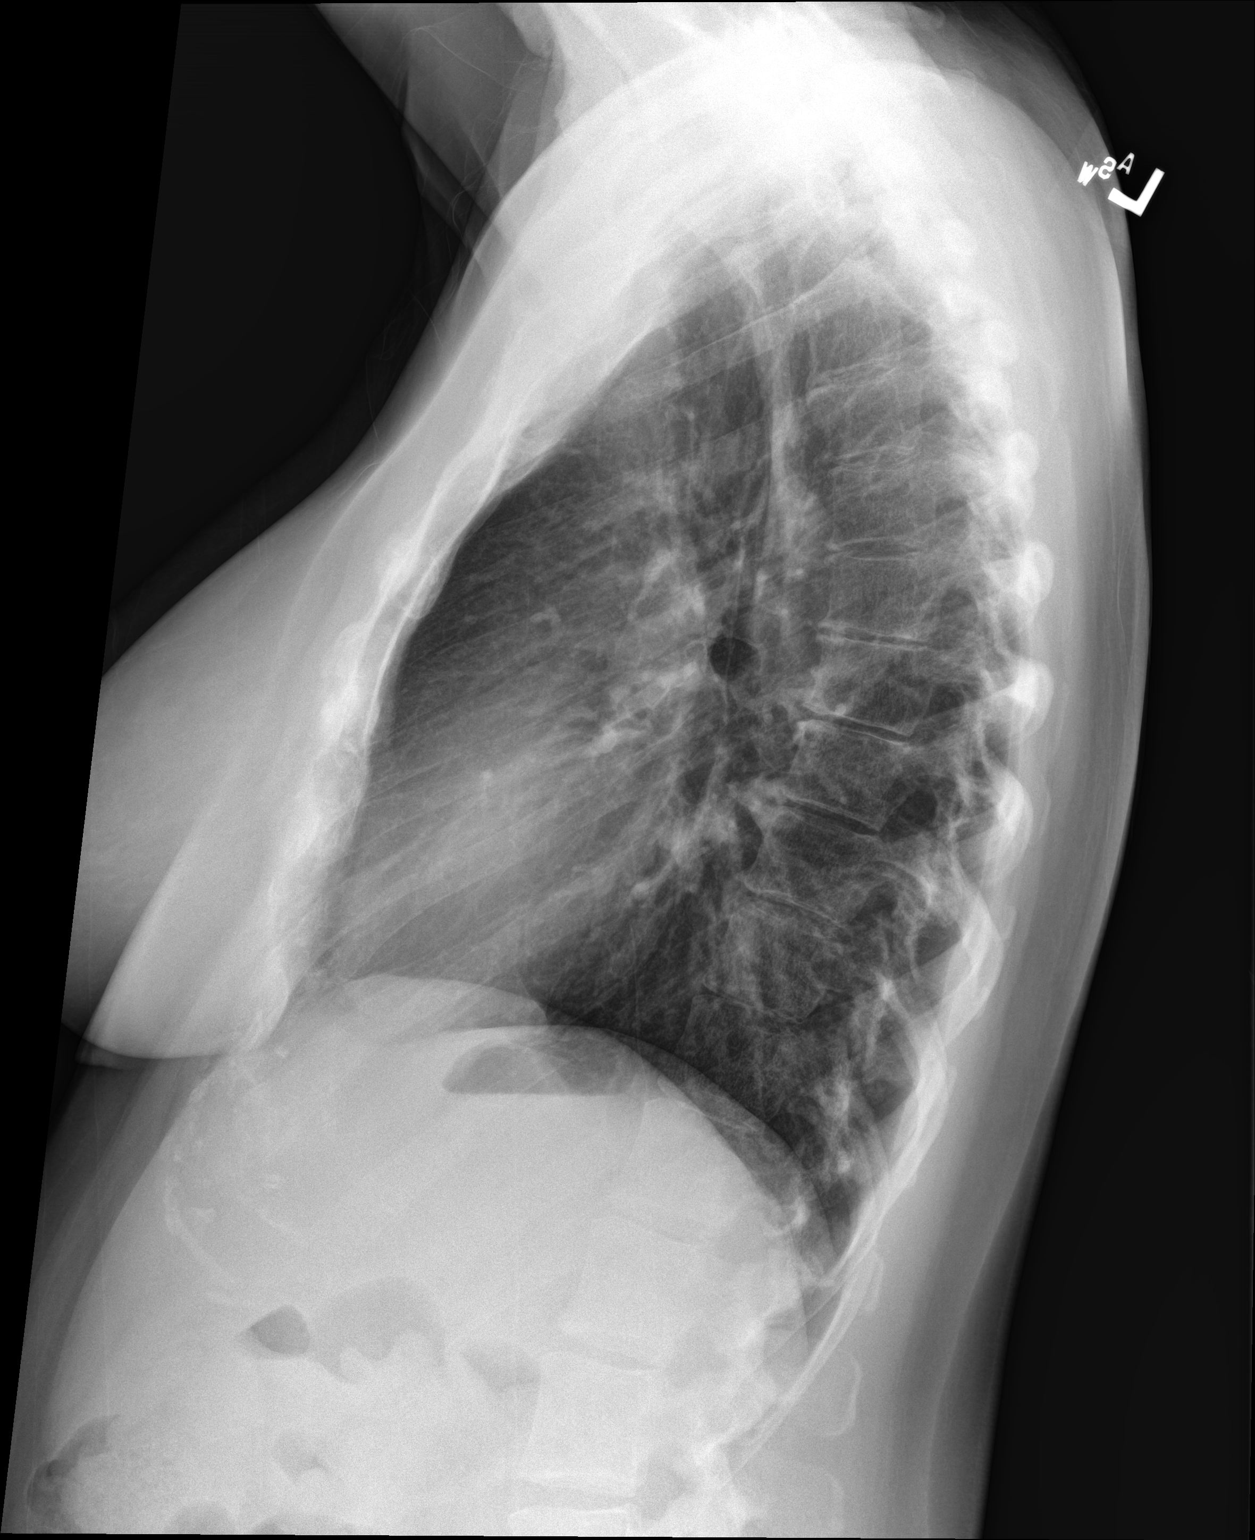

[2 of 2 positions shown; findings below may reference images not displayed]

FINDINGS: Heart size is normal. Mediastinal shadows are normal. The lungs are
clear. No bronchial thickening. No infiltrate, mass, effusion or
collapse. Pulmonary vascularity is normal. No bony abnormality.
IMPRESSION: Normal chest

## 2017-06-15 LAB — HM MAMMOGRAPHY

## 2017-07-16 ENCOUNTER — Other Ambulatory Visit: Payer: Self-pay | Admitting: Internal Medicine

## 2017-07-16 DIAGNOSIS — G43009 Migraine without aura, not intractable, without status migrainosus: Secondary | ICD-10-CM

## 2017-10-13 ENCOUNTER — Other Ambulatory Visit: Payer: Self-pay | Admitting: Internal Medicine

## 2017-12-25 ENCOUNTER — Other Ambulatory Visit: Payer: Self-pay | Admitting: Internal Medicine

## 2018-01-16 ENCOUNTER — Encounter: Payer: Self-pay | Admitting: Internal Medicine

## 2018-01-16 ENCOUNTER — Ambulatory Visit (INDEPENDENT_AMBULATORY_CARE_PROVIDER_SITE_OTHER): Payer: Commercial Managed Care - PPO | Admitting: Internal Medicine

## 2018-01-16 VITALS — BP 126/80 | HR 72 | Temp 97.5°F | Resp 16 | Ht 61.5 in | Wt 130.0 lb

## 2018-01-16 DIAGNOSIS — Z0001 Encounter for general adult medical examination with abnormal findings: Secondary | ICD-10-CM

## 2018-01-16 DIAGNOSIS — E782 Mixed hyperlipidemia: Secondary | ICD-10-CM

## 2018-01-16 DIAGNOSIS — Z0000 Encounter for general adult medical examination without abnormal findings: Secondary | ICD-10-CM

## 2018-01-16 DIAGNOSIS — G43009 Migraine without aura, not intractable, without status migrainosus: Secondary | ICD-10-CM

## 2018-01-16 DIAGNOSIS — I10 Essential (primary) hypertension: Secondary | ICD-10-CM

## 2018-01-16 LAB — POCT URINALYSIS DIPSTICK
Bilirubin, UA: NEGATIVE
GLUCOSE UA: NEGATIVE
KETONES UA: NEGATIVE
Leukocytes, UA: NEGATIVE
Nitrite, UA: NEGATIVE
Protein, UA: NEGATIVE
UROBILINOGEN UA: 0.2 U/dL
pH, UA: 6.5 (ref 5.0–8.0)

## 2018-01-16 MED ORDER — LOSARTAN POTASSIUM 50 MG PO TABS: 50.0000 mg | ORAL_TABLET | Freq: Every day | ORAL | 3 refills | Status: DC

## 2018-01-16 NOTE — Progress Notes (Signed)
Date:  01/16/2018   Name:  Brenda Keith   DOB:  [birth year]   MRN:  [id number]   Chief Complaint: Annual Exam and Hypertension Brenda Keith is a 52 y.o. female who presents today for her Complete Annual Exam. She feels well. She reports exercising regularly. She reports she is sleeping well. Pap was done in 2017.  Colonoscopy was done last year. Mammogram was done in November.  Hypertension  This is a chronic problem. The problem is unchanged. The problem is controlled. Pertinent negatives include no chest pain, headaches, palpitations or shortness of breath. Past treatments include angiotensin blockers.  Migraine   This is a recurrent problem. The problem occurs intermittently (has 2-3 migraines per year, lasting 2-3 days). The problem has been gradually improving. The pain quality is similar to prior headaches. Pertinent negatives include no abdominal pain, coughing, dizziness, fever, hearing loss, tinnitus or vomiting. She has tried triptans for the symptoms. The treatment provided significant relief. Her past medical history is significant for hypertension.    Review of Systems  Constitutional: Negative for chills, fatigue and fever.  HENT: Negative for congestion, hearing loss, tinnitus, trouble swallowing and voice change.   Eyes: Negative for visual disturbance.  Respiratory: Negative for cough, chest tightness, shortness of breath and wheezing.   Cardiovascular: Negative for chest pain, palpitations and leg swelling.  Gastrointestinal: Negative for abdominal pain, constipation, diarrhea and vomiting.  Endocrine: Negative for polydipsia and polyuria.  Genitourinary: Negative for dysuria, frequency, genital sores, vaginal bleeding and vaginal discharge.  Musculoskeletal: Negative for arthralgias, gait problem and joint swelling.  Skin: Negative for color change and rash.  Neurological: Negative for dizziness, tremors, light-headedness and headaches.  Hematological: Negative for  adenopathy. Does not bruise/bleed easily.  Psychiatric/Behavioral: Negative for dysphoric mood and sleep disturbance. The patient is not nervous/anxious.     Patient Active Problem List   Diagnosis Date Noted  . Tubular adenoma of colon 02/19/2017  . Hyperlipidemia 02/15/2015  . Atypical migraine 02/15/2015  . Essential (primary) hypertension 02/15/2015  . Neoplasm of uncertain behavior of skin 02/15/2015  . Allergic rhinitis, seasonal 02/15/2015  . Sinus congestion 02/15/2015  . Atypical nevus 06/07/2014    Prior to Admission medications   Medication Sig Start Date End Date Taking? Authorizing Provider  ALPRAZolam Duanne Moron) 0.5 MG tablet alprazolam 0.5 mg tablet  TK 1 T PO TID 12/06/16  Yes [provider]  clindamycin (CLINDAGEL) 1 % gel CLINDAMYCIN PHOSPHATE, 1% (External Gel) - Historical Medication  (1 %) Active   Yes [provider]  fluorouracil (EFUDEX) 5 % cream FLUOROURACIL, 5% (External Cream) - Historical Medication  (5 %) Active   Yes [provider]  losartan (COZAAR) 50 MG tablet TAKE 1 TABLET BY MOUTH  DAILY 12/26/17  Yes Glean Hess, MD  norgestrel-ethinyl estradiol (CRYSELLE-28) 0.3-30 MG-MCG tablet Take 1 tablet by mouth daily.   Yes [provider]  zolmitriptan (ZOMIG) 5 MG tablet TAKE 1 TABLET BY MOUTH AS NEEDED 07/16/17  Yes Glean Hess, MD  fluticasone [facility]) 50 MCG/ACT nasal spray Place 2 sprays into both nostrils daily. Patient not taking: Reported on 01/16/2018 02/15/15   Glean Hess, MD    Allergies  Allergen Reactions  . Ace Inhibitors Cough    Lisinopril     Past Surgical History:  Procedure Laterality Date  . APPENDECTOMY    . skin cancer removal Right 10/2015   face/ jaw line    Social History   Tobacco Use  .  Smoking status: Never Smoker  . Smokeless tobacco: Never Used  Substance Use Topics  . Alcohol use: Yes    Alcohol/week: 1.2 oz    Types: 2 Standard drinks or equivalent per  week    Comment: rarely  . Drug use: No     Medication list has been reviewed and updated.  Current Meds  Medication Sig  . ALPRAZolam (XANAX) 0.5 MG tablet alprazolam 0.5 mg tablet  TK 1 T PO TID  . clindamycin (CLINDAGEL) 1 % gel CLINDAMYCIN PHOSPHATE, 1% (External Gel) - Historical Medication  (1 %) Active  . fluorouracil (EFUDEX) 5 % cream FLUOROURACIL, 5% (External Cream) - Historical Medication  (5 %) Active  . losartan (COZAAR) 50 MG tablet TAKE 1 TABLET BY MOUTH  DAILY  . norgestrel-ethinyl estradiol (CRYSELLE-28) 0.3-30 MG-MCG tablet Take 1 tablet by mouth daily.  Marland Kitchen zolmitriptan (ZOMIG) 5 MG tablet TAKE 1 TABLET BY MOUTH AS NEEDED    PHQ 2/9 Scores 01/16/2018  PHQ - 2 Score 0    Physical Exam  Constitutional: She is oriented to person, place, and time. She appears well-developed and well-nourished. No distress.  HENT:  Head: Normocephalic and atraumatic.  Right Ear: Tympanic membrane and ear canal normal.  Left Ear: Tympanic membrane and ear canal normal.  Nose: Right sinus exhibits no maxillary sinus tenderness. Left sinus exhibits no maxillary sinus tenderness.  Mouth/Throat: Uvula is midline and oropharynx is clear and moist.  Eyes: Conjunctivae and EOM are normal. Right eye exhibits no discharge. Left eye exhibits no discharge. No scleral icterus.  Neck: Normal range of motion. Carotid bruit is not present. No erythema present. No thyromegaly present.  Cardiovascular: Normal rate, regular rhythm, normal heart sounds and normal pulses.  Pulmonary/Chest: Effort normal. No respiratory distress. She has no wheezes.  Abdominal: Soft. Bowel sounds are normal. There is no hepatosplenomegaly. There is no tenderness. There is no CVA tenderness.  Musculoskeletal: Normal range of motion.  Lymphadenopathy:    She has no cervical adenopathy.    She has no axillary adenopathy.  Neurological: She is alert and oriented to person, place, and time. She has normal reflexes. No  cranial nerve deficit or sensory deficit.  Skin: Skin is warm, dry and intact. No rash noted.  Psychiatric: She has a normal mood and affect. Her speech is normal and behavior is normal. Thought content normal.  Nursing note and vitals reviewed.   BP 126/80   Pulse 72   Temp (!) 97.5 F (36.4 C) (Oral)   Resp 16   Ht 5' 1.5" (1.562 m)   Wt 130 lb (59 kg)   SpO2 98%   BMI 24.17 kg/m   Assessment and Plan: 1. Annual physical exam Continue regular exercise and healthy diet GYN follow up for Pap and Mammo - POCT urinalysis dipstick  2. Essential (primary) hypertension controlled - CBC with Differential/Platelet - Comprehensive metabolic panel - TSH - losartan (COZAAR) 50 MG tablet; Take 1 tablet (50 mg total) by mouth daily.  Dispense: 90 tablet; Refill: 3  3. Atypical migraine Stable with minimal total migraine days per year (6-9)  4. Mixed hyperlipidemia - Lipid panel   Meds ordered this encounter  Medications  . losartan (COZAAR) 50 MG tablet    Sig: Take 1 tablet (50 mg total) by mouth daily.    Dispense:  90 tablet    Refill:  3    Pt requests generic manufacturer [employer] only please!    Partially dictated using Editor, commissioning. Any errors  are unintentional.  Halina Maidens, MD [facility]  01/16/2018

## 2018-01-16 NOTE — Patient Instructions (Signed)

## 2018-01-17 LAB — LIPID PANEL
CHOLESTEROL TOTAL: 199 mg/dL (ref 100–199)
Chol/HDL Ratio: 4.7 ratio — ABNORMAL HIGH (ref 0.0–4.4)
HDL: 42 mg/dL (ref >39)
LDL CALC: 126 mg/dL — AB (ref 0–99)
Triglycerides: 155 mg/dL — ABNORMAL HIGH (ref 0–149)
VLDL CHOLESTEROL CAL: 31 mg/dL (ref 5–40)

## 2018-01-17 LAB — COMPREHENSIVE METABOLIC PANEL
ALK PHOS: 35 IU/L — AB (ref 39–117)
ALT: 10 IU/L (ref 0–32)
AST: 17 IU/L (ref 0–40)
Albumin/Globulin Ratio: 1.7 (ref 1.2–2.2)
Albumin: 4.3 g/dL (ref 3.5–5.5)
BUN/Creatinine Ratio: 10 (ref 9–23)
BUN: 8 mg/dL (ref 6–24)
Bilirubin Total: 0.5 mg/dL (ref 0.0–1.2)
CALCIUM: 9.2 mg/dL (ref 8.7–10.2)
CO2: 21 mmol/L (ref 20–29)
CREATININE: 0.77 mg/dL (ref 0.57–1.00)
Chloride: 102 mmol/L (ref 96–106)
GFR calc non Af Amer: 90 mL/min/{1.73_m2} (ref >59)
GFR, EST AFRICAN AMERICAN: 103 mL/min/{1.73_m2} (ref >59)
GLUCOSE: 81 mg/dL (ref 65–99)
Globulin, Total: 2.5 g/dL (ref 1.5–4.5)
Potassium: 4.3 mmol/L (ref 3.5–5.2)
Sodium: 141 mmol/L (ref 134–144)
TOTAL PROTEIN: 6.8 g/dL (ref 6.0–8.5)

## 2018-01-17 LAB — CBC WITH DIFFERENTIAL/PLATELET
BASOS ABS: 0 10*3/uL (ref 0.0–0.2)
BASOS: 0 %
EOS (ABSOLUTE): 0.1 10*3/uL (ref 0.0–0.4)
EOS: 2 %
Hematocrit: 40.5 % (ref 34.0–46.6)
Hemoglobin: 14 g/dL (ref 11.1–15.9)
IMMATURE GRANS (ABS): 0 10*3/uL (ref 0.0–0.1)
IMMATURE GRANULOCYTES: 0 %
LYMPHS ABS: 1.9 10*3/uL (ref 0.7–3.1)
Lymphs: 32 %
MCH: 31 pg (ref 26.6–33.0)
MCHC: 34.6 g/dL (ref 31.5–35.7)
MCV: 90 fL (ref 79–97)
MONOS ABS: 0.3 10*3/uL (ref 0.1–0.9)
Monocytes: 4 %
NEUTROS ABS: 3.7 10*3/uL (ref 1.4–7.0)
NEUTROS PCT: 62 %
Platelets: 237 10*3/uL (ref 150–450)
RBC: 4.52 x10E6/uL (ref 3.77–5.28)
RDW: 13.6 % (ref 12.3–15.4)
WBC: 6 10*3/uL (ref 3.4–10.8)

## 2018-01-17 LAB — TSH: TSH: 1.39 u[IU]/mL (ref 0.450–4.500)

## 2018-01-30 ENCOUNTER — Encounter: Payer: Self-pay | Admitting: Internal Medicine

## 2018-02-01 NOTE — Telephone Encounter (Signed)
Release form mailed to patient for Pap smear results to be send to Dr. Army Melia.

## 2018-12-26 LAB — RESULTS CONSOLE HPV: CHL HPV: NEGATIVE

## 2018-12-26 LAB — HM PAP SMEAR: HM Pap smear: NEGATIVE

## 2019-01-02 LAB — HM PAP SMEAR: HM Pap smear: NORMAL

## 2019-01-07 ENCOUNTER — Encounter: Payer: Self-pay | Admitting: Internal Medicine

## 2019-01-25 ENCOUNTER — Other Ambulatory Visit: Payer: Self-pay | Admitting: Internal Medicine

## 2019-01-25 DIAGNOSIS — I10 Essential (primary) hypertension: Secondary | ICD-10-CM

## 2019-01-28 NOTE — Progress Notes (Signed)
Date:  01/29/2019   Name:  Brenda Keith   DOB:  05-06-1966   MRN:  [id number]   Chief Complaint: Annual Exam (Breast Exam. No pap.) Ayden Apodaca is a 53 y.o. female who presents today for her Complete Annual Exam. She feels well. She reports exercising running regularly. She reports she is sleeping well. She denies breast issues - had exam with GYN last week.  She is interested in Shingrix.  Mammogram  05/2017 Colonoscopy  01/2017 Pap 12/2018  Hypertension This is a chronic problem. The problem is controlled. Associated symptoms include headaches. Pertinent negatives include no chest pain, palpitations or shortness of breath. Past treatments include angiotensin blockers. The current treatment provides significant improvement. There are no compliance problems.   Migraine  This is a recurrent problem. The problem occurs monthly. The problem has been unchanged. Pertinent negatives include no abdominal pain, coughing, dizziness, fever, hearing loss, tinnitus or vomiting. She has tried triptans for the symptoms. Her past medical history is significant for hypertension.  Hyperlipidemia This is a chronic problem. Pertinent negatives include no chest pain or shortness of breath.   Lab Results  Component Value Date   TSH 1.390 01/16/2018   Lab Results  Component Value Date   CHOL 199 01/16/2018   HDL 42 01/16/2018   LDLCALC 126 (H) 01/16/2018   TRIG 155 (H) 01/16/2018   CHOLHDL 4.7 (H) 01/16/2018   Lab Results  Component Value Date   CREATININE 0.77 01/16/2018   BUN 8 01/16/2018   NA 141 01/16/2018   K 4.3 01/16/2018   CL 102 01/16/2018   CO2 21 01/16/2018     Review of Systems  Constitutional: Negative for chills, fatigue and fever.  HENT: Positive for postnasal drip. Negative for congestion, hearing loss, tinnitus, trouble swallowing and voice change.   Eyes: Negative for visual disturbance.  Respiratory: Negative for cough, chest tightness, shortness of breath and wheezing.    Cardiovascular: Negative for chest pain, palpitations and leg swelling.  Gastrointestinal: Negative for abdominal pain, constipation, diarrhea and vomiting.  Endocrine: Negative for polydipsia and polyuria.  Genitourinary: Negative for dysuria, frequency, genital sores, vaginal bleeding and vaginal discharge.  Musculoskeletal: Negative for arthralgias, gait problem and joint swelling.  Skin: Negative for color change and rash.  Allergic/Immunologic: Positive for environmental allergies.  Neurological: Positive for headaches. Negative for dizziness, tremors and light-headedness.  Hematological: Negative for adenopathy. Does not bruise/bleed easily.  Psychiatric/Behavioral: Negative for dysphoric mood and sleep disturbance. The patient is not nervous/anxious.     Patient Active Problem List   Diagnosis Date Noted  . Tubular adenoma of colon 02/19/2017  . Basal cell carcinoma of skin 12/01/2015  . Hyperlipidemia 02/15/2015  . Atypical migraine 02/15/2015  . Essential (primary) hypertension 02/15/2015  . Neoplasm of uncertain behavior of skin 02/15/2015  . Allergic rhinitis, seasonal 02/15/2015  . Sinus congestion 02/15/2015  . Atypical nevus 06/07/2014    Allergies  Allergen Reactions  . Ace Inhibitors Cough    Lisinopril   . Grass Extracts [Gramineae Pollens]   . Molds & Smuts     Past Surgical History:  Procedure Laterality Date  . APPENDECTOMY    . COLONOSCOPY  02/19/2017   tubular adenoma - repeat 5 yr  . skin cancer removal Right 10/2015   face/ jaw line    Social History   Tobacco Use  . Smoking status: Never Smoker  . Smokeless tobacco: Never Used  Substance Use Topics  . Alcohol use: Yes  Alcohol/week: 2.0 standard drinks    Types: 2 Standard drinks or equivalent per week    Comment: rarely  . Drug use: No     Medication list has been reviewed and updated.  Current Meds  Medication Sig  . ALPRAZolam (XANAX) 0.5 MG tablet alprazolam 0.5 mg tablet   TK 1 T PO TID  . azelastine (ASTELIN) 0.1 % nasal spray   . fluticasone (FLONASE) 50 MCG/ACT nasal spray Place 2 sprays into both nostrils daily.  Marland Kitchen losartan (COZAAR) 50 MG tablet TAKE 1 TABLET BY MOUTH  DAILY  . meloxicam (MOBIC) 7.5 MG tablet PRN  . norgestrel-ethinyl estradiol (CRYSELLE-28) 0.3-30 MG-MCG tablet Take 1 tablet by mouth daily.  Marland Kitchen tretinoin (RETIN-A) 0.05 % cream PRN  . zolmitriptan (ZOMIG) 5 MG tablet TAKE 1 TABLET BY MOUTH AS NEEDED    PHQ 2/9 Scores 01/29/2019 01/16/2018  PHQ - 2 Score 0 0    BP Readings from Last 3 Encounters:  01/29/19 108/70  01/16/18 126/80  11/14/16 138/78    Physical Exam Vitals signs and nursing note reviewed.  Constitutional:      General: She is not in acute distress.    Appearance: She is well-developed.  HENT:     Head: Normocephalic and atraumatic.     Right Ear: Tympanic membrane and ear canal normal.     Left Ear: Tympanic membrane and ear canal normal.     Nose:     Right Sinus: No maxillary sinus tenderness.     Left Sinus: No maxillary sinus tenderness.     Mouth/Throat:     Pharynx: Uvula midline.  Eyes:     General: No scleral icterus.       Right eye: No discharge.        Left eye: No discharge.     Conjunctiva/sclera: Conjunctivae normal.  Neck:     Musculoskeletal: Normal range of motion. No erythema.     Thyroid: No thyromegaly.     Vascular: No carotid bruit.  Cardiovascular:     Rate and Rhythm: Normal rate and regular rhythm.     Pulses: Normal pulses.     Heart sounds: Normal heart sounds.  Pulmonary:     Effort: Pulmonary effort is normal. No respiratory distress.     Breath sounds: No wheezing.  Abdominal:     General: Bowel sounds are normal.     Palpations: Abdomen is soft.     Tenderness: There is no abdominal tenderness.  Musculoskeletal: Normal range of motion.     Right lower leg: No edema.     Left lower leg: No edema.  Lymphadenopathy:     Cervical: No cervical adenopathy.  Skin:     General: Skin is warm and dry.     Capillary Refill: Capillary refill takes less than 2 seconds.     Findings: No rash.  Neurological:     Mental Status: She is alert and oriented to person, place, and time.     Cranial Nerves: No cranial nerve deficit.     Sensory: No sensory deficit.     Deep Tendon Reflexes: Reflexes are normal and symmetric.  Psychiatric:        Speech: Speech normal.        Behavior: Behavior normal.        Thought Content: Thought content normal.     Wt Readings from Last 3 Encounters:  01/29/19 128 lb (58.1 kg)  01/16/18 130 lb (59 kg)  11/14/16 134 lb 6.4  oz (61 kg)    BP 108/70   Pulse 80   Ht 5' 1.5" (1.562 m)   Wt 128 lb (58.1 kg)   SpO2 100%   BMI 23.79 kg/m   Assessment and Plan: 1. Annual physical exam Normal exam Mammogram by GYN - POCT urinalysis dipstick  2. Essential (primary) hypertension controlled - CBC with Differential/Platelet - Comprehensive metabolic panel - TSH  3. Atypical migraine Continues to respond well to triptans  4. Mixed hyperlipidemia Check labs - Lipid panel  5. Need for shingles vaccine First dose today - Varicella-zoster vaccine IM   Partially dictated using Editor, commissioning. Any errors are unintentional.  Halina Maidens, MD [facility]  01/29/2019

## 2019-01-29 ENCOUNTER — Ambulatory Visit (INDEPENDENT_AMBULATORY_CARE_PROVIDER_SITE_OTHER): Payer: Commercial Managed Care - PPO | Admitting: Internal Medicine

## 2019-01-29 ENCOUNTER — Encounter: Payer: Self-pay | Admitting: Internal Medicine

## 2019-01-29 ENCOUNTER — Other Ambulatory Visit: Payer: Self-pay

## 2019-01-29 VITALS — BP 122/74 | HR 80 | Ht 61.5 in | Wt 128.0 lb

## 2019-01-29 DIAGNOSIS — I10 Essential (primary) hypertension: Secondary | ICD-10-CM | POA: Diagnosis not present

## 2019-01-29 DIAGNOSIS — E782 Mixed hyperlipidemia: Secondary | ICD-10-CM | POA: Diagnosis not present

## 2019-01-29 DIAGNOSIS — Z23 Encounter for immunization: Secondary | ICD-10-CM

## 2019-01-29 DIAGNOSIS — G43009 Migraine without aura, not intractable, without status migrainosus: Secondary | ICD-10-CM | POA: Diagnosis not present

## 2019-01-29 DIAGNOSIS — Z0000 Encounter for general adult medical examination without abnormal findings: Secondary | ICD-10-CM

## 2019-01-29 LAB — POCT URINALYSIS DIPSTICK
Bilirubin, UA: NEGATIVE
Glucose, UA: NEGATIVE
Leukocytes, UA: NEGATIVE
Nitrite, UA: NEGATIVE
Protein, UA: NEGATIVE
Spec Grav, UA: >=1.03 — AB (ref 1.010–1.025)
Urobilinogen, UA: 0.2 E.U./dL
pH, UA: 5 (ref 5.0–8.0)

## 2019-01-29 NOTE — Patient Instructions (Signed)
Health Maintenance, Female Adopting a healthy lifestyle and getting preventive care are important in promoting health and wellness. Ask your health care provider about:  The right schedule for you to have regular tests and exams.  Things you can do on your own to prevent diseases and keep yourself healthy. What should I know about diet, weight, and exercise? Eat a healthy diet   Eat a diet that includes plenty of vegetables, fruits, low-fat dairy products, and lean protein.  Do not eat a lot of foods that are high in solid fats, added sugars, or sodium. Maintain a healthy weight Body mass index (BMI) is used to identify weight problems. It estimates body fat based on height and weight. Your health care provider can help determine your BMI and help you achieve or maintain a healthy weight. Get regular exercise Get regular exercise. This is one of the most important things you can do for your health. Most adults should:  Exercise for at least 150 minutes each week. The exercise should increase your heart rate and make you sweat (moderate-intensity exercise).  Do strengthening exercises at least twice a week. This is in addition to the moderate-intensity exercise.  Spend less time sitting. Even light physical activity can be beneficial. Watch cholesterol and blood lipids Have your blood tested for lipids and cholesterol at 53 years of age, then have this test every 5 years. Have your cholesterol levels checked more often if:  Your lipid or cholesterol levels are high.  You are older than 53 years of age.  You are at high risk for heart disease. What should I know about cancer screening? Depending on your health history and family history, you may need to have cancer screening at various ages. This may include screening for:  Breast cancer.  Cervical cancer.  Colorectal cancer.  Skin cancer.  Lung cancer. What should I know about heart disease, diabetes, and high blood  pressure? Blood pressure and heart disease  High blood pressure causes heart disease and increases the risk of stroke. This is more likely to develop in people who have high blood pressure readings, are of African descent, or are overweight.  Have your blood pressure checked: ? Every 3-5 years if you are 18-39 years of age. ? Every year if you are 40 years old or older. Diabetes Have regular diabetes screenings. This checks your fasting blood sugar level. Have the screening done:  Once every three years after age 40 if you are at a normal weight and have a low risk for diabetes.  More often and at a younger age if you are overweight or have a high risk for diabetes. What should I know about preventing infection? Hepatitis B If you have a higher risk for hepatitis B, you should be screened for this virus. Talk with your health care provider to find out if you are at risk for hepatitis B infection. Hepatitis C Testing is recommended for:  Everyone born from 1945 through 1965.  Anyone with known risk factors for hepatitis C. Sexually transmitted infections (STIs)  Get screened for STIs, including gonorrhea and chlamydia, if: ? You are sexually active and are younger than 53 years of age. ? You are older than 53 years of age and your health care provider tells you that you are at risk for this type of infection. ? Your sexual activity has changed since you were last screened, and you are at increased risk for chlamydia or gonorrhea. Ask your health care provider if   you are at risk.  Ask your health care provider about whether you are at high risk for HIV. Your health care provider may recommend a prescription medicine to help prevent HIV infection. If you choose to take medicine to prevent HIV, you should first get tested for HIV. You should then be tested every 3 months for as long as you are taking the medicine. Pregnancy  If you are about to stop having your period (premenopausal) and  you may become pregnant, seek counseling before you get pregnant.  Take 400 to 800 micrograms (mcg) of folic acid every day if you become pregnant.  Ask for birth control (contraception) if you want to prevent pregnancy. Osteoporosis and menopause Osteoporosis is a disease in which the bones lose minerals and strength with aging. This can result in bone fractures. If you are 65 years old or older, or if you are at risk for osteoporosis and fractures, ask your health care provider if you should:  Be screened for bone loss.  Take a calcium or vitamin D supplement to lower your risk of fractures.  Be given hormone replacement therapy (HRT) to treat symptoms of menopause. Follow these instructions at home: Lifestyle  Do not use any products that contain nicotine or tobacco, such as cigarettes, e-cigarettes, and chewing tobacco. If you need help quitting, ask your health care provider.  Do not use street drugs.  Do not share needles.  Ask your health care provider for help if you need support or information about quitting drugs. Alcohol use  Do not drink alcohol if: ? Your health care provider tells you not to drink. ? You are pregnant, may be pregnant, or are planning to become pregnant.  If you drink alcohol: ? Limit how much you use to 0-1 drink a day. ? Limit intake if you are breastfeeding.  Be aware of how much alcohol is in your drink. In the [place] one drink equals one 12 oz bottle of beer (355 mL), one 5 oz glass of wine (148 mL), or one 1 oz glass of hard liquor (44 mL). General instructions  Schedule regular health, dental, and eye exams.  Stay current with your vaccines.  Tell your health care provider if: ? You often feel depressed. ? You have ever been abused or do not feel safe at home. Summary  Adopting a healthy lifestyle and getting preventive care are important in promoting health and wellness.  Follow your health care provider's instructions about healthy  diet, exercising, and getting tested or screened for diseases.  Follow your health care provider's instructions on monitoring your cholesterol and blood pressure. This information is not intended to replace advice given to you by your health care provider. Make sure you discuss any questions you have with your health care provider. Document Released: 01/30/2011 Document Revised: 07/10/2018 Document Reviewed: 07/10/2018 Elsevier Patient Education  2020 [employer]  

## 2019-01-30 LAB — COMPREHENSIVE METABOLIC PANEL
ALT: 11 IU/L (ref 0–32)
AST: 12 IU/L (ref 0–40)
Albumin/Globulin Ratio: 1.8 (ref 1.2–2.2)
Albumin: 4.6 g/dL (ref 3.8–4.9)
Alkaline Phosphatase: 38 IU/L — ABNORMAL LOW (ref 39–117)
BUN/Creatinine Ratio: 11 (ref 9–23)
BUN: 9 mg/dL (ref 6–24)
Bilirubin Total: 0.6 mg/dL (ref 0.0–1.2)
CO2: 21 mmol/L (ref 20–29)
Calcium: 9.5 mg/dL (ref 8.7–10.2)
Chloride: 103 mmol/L (ref 96–106)
Creatinine, Ser: 0.82 mg/dL (ref 0.57–1.00)
GFR calc Af Amer: 95 mL/min/{1.73_m2} (ref >59)
GFR calc non Af Amer: 83 mL/min/{1.73_m2} (ref >59)
Globulin, Total: 2.6 g/dL (ref 1.5–4.5)
Glucose: 87 mg/dL (ref 65–99)
Potassium: 4.3 mmol/L (ref 3.5–5.2)
Sodium: 141 mmol/L (ref 134–144)
Total Protein: 7.2 g/dL (ref 6.0–8.5)

## 2019-01-30 LAB — CBC WITH DIFFERENTIAL/PLATELET
Basophils Absolute: 0.1 10*3/uL (ref 0.0–0.2)
Basos: 1 %
EOS (ABSOLUTE): 0.1 10*3/uL (ref 0.0–0.4)
Eos: 1 %
Hematocrit: 41.2 % (ref 34.0–46.6)
Hemoglobin: 14.2 g/dL (ref 11.1–15.9)
Immature Grans (Abs): 0 10*3/uL (ref 0.0–0.1)
Immature Granulocytes: 0 %
Lymphocytes Absolute: 2.2 10*3/uL (ref 0.7–3.1)
Lymphs: 34 %
MCH: 31.8 pg (ref 26.6–33.0)
MCHC: 34.5 g/dL (ref 31.5–35.7)
MCV: 92 fL (ref 79–97)
Monocytes Absolute: 0.3 10*3/uL (ref 0.1–0.9)
Monocytes: 5 %
Neutrophils Absolute: 3.7 10*3/uL (ref 1.4–7.0)
Neutrophils: 59 %
Platelets: 267 10*3/uL (ref 150–450)
RBC: 4.46 x10E6/uL (ref 3.77–5.28)
RDW: 12.3 % (ref 11.7–15.4)
WBC: 6.4 10*3/uL (ref 3.4–10.8)

## 2019-01-30 LAB — LIPID PANEL
Chol/HDL Ratio: 5.1 ratio — ABNORMAL HIGH (ref 0.0–4.4)
Cholesterol, Total: 208 mg/dL — ABNORMAL HIGH (ref 100–199)
HDL: 41 mg/dL (ref >39)
LDL Calculated: 130 mg/dL — ABNORMAL HIGH (ref 0–99)
Triglycerides: 185 mg/dL — ABNORMAL HIGH (ref 0–149)
VLDL Cholesterol Cal: 37 mg/dL (ref 5–40)

## 2019-01-30 LAB — TSH: TSH: 1.79 u[IU]/mL (ref 0.450–4.500)

## 2019-02-10 ENCOUNTER — Telehealth: Payer: Self-pay

## 2019-02-10 NOTE — Telephone Encounter (Signed)
It will have to run its course. She can take probiotics and imodium if she needs to be able to leave the house.

## 2019-02-10 NOTE — Telephone Encounter (Signed)
Added to allergy list. Patient informed of Dr. Sharmaine Base note. Will try probiotics or imodium as needed.

## 2019-02-10 NOTE — Telephone Encounter (Signed)
Patient called leaving VM. She seen her eye doctor last week for an eye infection. She was given abx Augmentin 500 mg TID. After a few days she had to stop the abx because she had severe diarrhea, nausea and vomiting. Severe gas pains and bloating. She still has these symptoms and they are not getting better. She called her eye doctor who told her to call her PCP for advise to see if there is something you recommend or if this will have to run its course.  Please advise.

## 2019-06-30 ENCOUNTER — Other Ambulatory Visit: Payer: Self-pay

## 2019-06-30 ENCOUNTER — Ambulatory Visit (INDEPENDENT_AMBULATORY_CARE_PROVIDER_SITE_OTHER): Payer: Commercial Managed Care - PPO

## 2019-06-30 DIAGNOSIS — Z23 Encounter for immunization: Secondary | ICD-10-CM

## 2019-10-16 ENCOUNTER — Ambulatory Visit: Payer: Commercial Managed Care - PPO

## 2019-10-17 ENCOUNTER — Ambulatory Visit: Payer: Commercial Managed Care - PPO | Attending: Internal Medicine

## 2019-10-17 DIAGNOSIS — Z23 Encounter for immunization: Secondary | ICD-10-CM

## 2019-10-17 NOTE — Progress Notes (Signed)
   Covid-19 Vaccination Clinic  Name:  Brenda Keith    MRN: [id number] DOB: 25-Oct-1965  10/17/2019  [name] was observed post Covid-19 immunization for 15 minutes without incident. She was provided with Vaccine Information Sheet and instruction to access the V-Safe system.   [name] was instructed to call 911 with any severe reactions post vaccine: Marland Kitchen Difficulty breathing  . Swelling of face and throat  . A fast heartbeat  . A bad rash all over body  . Dizziness and weakness   Immunizations Administered    Name Date Dose VIS Date Route   Pfizer COVID-19 Vaccine 10/17/2019  3:35 PM 0.3 mL 07/11/2019 Intramuscular   Manufacturer: [place]   Lot: [id number]   [place]: [id number]

## 2019-11-06 DIAGNOSIS — M1812 Unilateral primary osteoarthritis of first carpometacarpal joint, left hand: Secondary | ICD-10-CM | POA: Insufficient documentation

## 2019-11-12 ENCOUNTER — Ambulatory Visit: Payer: Commercial Managed Care - PPO | Attending: Internal Medicine

## 2019-11-12 DIAGNOSIS — Z23 Encounter for immunization: Secondary | ICD-10-CM

## 2019-11-12 NOTE — Progress Notes (Signed)
   Covid-19 Vaccination Clinic  Name:  Brenda Keith    MRN: [id number] DOB: 1966/06/30  11/12/2019  [name] was observed post Covid-19 immunization for 15 minutes without incident. She was provided with Vaccine Information Sheet and instruction to access the V-Safe system.   [name] was instructed to call 911 with any severe reactions post vaccine: Marland Kitchen Difficulty breathing  . Swelling of face and throat  . A fast heartbeat  . A bad rash all over body  . Dizziness and weakness   Immunizations Administered    Name Date Dose VIS Date Route   Pfizer COVID-19 Vaccine 11/12/2019  3:23 PM 0.3 mL 07/11/2019 Intramuscular   Manufacturer: [place]   Lot: [id number]   [place]: [id number]

## 2019-12-20 ENCOUNTER — Other Ambulatory Visit: Payer: Self-pay | Admitting: Internal Medicine

## 2019-12-20 DIAGNOSIS — I10 Essential (primary) hypertension: Secondary | ICD-10-CM

## 2019-12-20 NOTE — Telephone Encounter (Signed)
Requested medications are due for refill today? Yes  Requested medications are on active medication list?  Yes  Last Refill:  01/26/2019   # 90 with 3 refills   Future visit scheduled?  Yes  Notes to Clinic:  Medication failed RX refill protocol due to no valid encounter in the last 6 months and no labs within last 180 days.

## 2020-02-05 ENCOUNTER — Other Ambulatory Visit: Payer: Self-pay

## 2020-02-05 ENCOUNTER — Ambulatory Visit (INDEPENDENT_AMBULATORY_CARE_PROVIDER_SITE_OTHER): Payer: Commercial Managed Care - PPO | Admitting: Internal Medicine

## 2020-02-05 ENCOUNTER — Encounter: Payer: Self-pay | Admitting: Internal Medicine

## 2020-02-05 VITALS — BP 128/78 | HR 69 | Temp 97.8°F | Ht 61.5 in | Wt 129.0 lb

## 2020-02-05 DIAGNOSIS — E782 Mixed hyperlipidemia: Secondary | ICD-10-CM

## 2020-02-05 DIAGNOSIS — N951 Menopausal and female climacteric states: Secondary | ICD-10-CM

## 2020-02-05 DIAGNOSIS — Z1231 Encounter for screening mammogram for malignant neoplasm of breast: Secondary | ICD-10-CM

## 2020-02-05 DIAGNOSIS — G43009 Migraine without aura, not intractable, without status migrainosus: Secondary | ICD-10-CM

## 2020-02-05 DIAGNOSIS — Z0000 Encounter for general adult medical examination without abnormal findings: Secondary | ICD-10-CM

## 2020-02-05 DIAGNOSIS — C44519 Basal cell carcinoma of skin of other part of trunk: Secondary | ICD-10-CM

## 2020-02-05 DIAGNOSIS — Z1159 Encounter for screening for other viral diseases: Secondary | ICD-10-CM | POA: Diagnosis not present

## 2020-02-05 DIAGNOSIS — I10 Essential (primary) hypertension: Secondary | ICD-10-CM | POA: Diagnosis not present

## 2020-02-05 LAB — POCT URINALYSIS DIPSTICK
Bilirubin, UA: NEGATIVE
Glucose, UA: NEGATIVE
Ketones, UA: NEGATIVE
Leukocytes, UA: NEGATIVE
Nitrite, UA: NEGATIVE
Protein, UA: NEGATIVE
Spec Grav, UA: 1.01 (ref 1.010–1.025)
Urobilinogen, UA: 0.2 E.U./dL
pH, UA: 5 (ref 5.0–8.0)

## 2020-02-05 MED ORDER — LOSARTAN POTASSIUM 50 MG PO TABS: 50.0000 mg | ORAL_TABLET | Freq: Every day | ORAL | 3 refills | Status: DC

## 2020-02-05 NOTE — Progress Notes (Signed)
Date:  02/05/2020   Name:  Brenda Keith   DOB:  1965/09/04   MRN:  [id number]   Chief Complaint: Annual Exam (no breast exam(GYN) no pap)  Brenda Keith is a 54 y.o. female who presents today for her Complete Annual Exam. She feels well. She reports exercising daily running and walking . She reports she is sleeping fairly well. Breast complaints none. She recently stopped her OCPs due to being in menopause.  Mammogram: 06/2019 Pap smear: 2020 Colonoscopy:  01/2017 TA; repeat 5 yrs  Immunization History  Administered Date(s) Administered  . Influenza,inj,Quad PF,6+ Mos 05/05/2019  . Influenza-Unspecified 04/30/2014, 05/01/2015, 04/30/2016, 04/30/2018, 05/12/2019  . PFIZER SARS-COV-2 Vaccination 10/17/2019, 11/12/2019  . Tdap 02/13/2011  . Zoster Recombinat (Shingrix) 01/29/2019, 06/30/2019    Hypertension This is a chronic problem. The problem is controlled. Associated symptoms include headaches. Pertinent negatives include no chest pain, palpitations or shortness of breath. Past treatments include angiotensin blockers. The current treatment provides significant improvement. There are no compliance problems.   Migraine  This is a recurrent problem. The pain quality is similar to prior headaches. Associated symptoms include sinus pressure. Pertinent negatives include no abdominal pain, coughing, dizziness, fever, hearing loss, tinnitus or vomiting. She has tried triptans for the symptoms. The treatment provided significant relief. Her past medical history is significant for hypertension.    Lab Results  Component Value Date   CREATININE 0.82 01/29/2019   BUN 9 01/29/2019   NA 141 01/29/2019   K 4.3 01/29/2019   CL 103 01/29/2019   CO2 21 01/29/2019   Lab Results  Component Value Date   CHOL 208 (H) 01/29/2019   HDL 41 01/29/2019   LDLCALC 130 (H) 01/29/2019   TRIG 185 (H) 01/29/2019   CHOLHDL 5.1 (H) 01/29/2019   Lab Results  Component Value Date   TSH 1.790 01/29/2019    No results found for: HGBA1C Lab Results  Component Value Date   WBC 6.4 01/29/2019   HGB 14.2 01/29/2019   HCT 41.2 01/29/2019   MCV 92 01/29/2019   PLT 267 01/29/2019   Lab Results  Component Value Date   ALT 11 01/29/2019   AST 12 01/29/2019   ALKPHOS 38 (L) 01/29/2019   BILITOT 0.6 01/29/2019     Review of Systems  Constitutional: Positive for diaphoresis (mild hot flashes). Negative for chills, fatigue and fever.  HENT: Positive for postnasal drip and sinus pressure. Negative for congestion, hearing loss, tinnitus, trouble swallowing and voice change.   Eyes: Negative for visual disturbance.  Respiratory: Negative for cough, chest tightness, shortness of breath and wheezing.   Cardiovascular: Negative for chest pain, palpitations and leg swelling.  Gastrointestinal: Negative for abdominal pain, constipation, diarrhea and vomiting.  Endocrine: Negative for polydipsia and polyuria.  Genitourinary: Negative for dysuria, frequency, genital sores, vaginal bleeding and vaginal discharge.  Musculoskeletal: Positive for arthralgias (hips and left thumb). Negative for gait problem and joint swelling.  Skin: Negative for color change and rash.  Neurological: Positive for headaches. Negative for dizziness, tremors and light-headedness.  Hematological: Negative for adenopathy. Does not bruise/bleed easily.  Psychiatric/Behavioral: Negative for dysphoric mood. Sleep disturbance: wakes frequently - trying melatonin. The patient is not nervous/anxious.     Patient Active Problem List   Diagnosis Date Noted  . Primary osteoarthritis of first carpometacarpal joint of one hand, left 11/06/2019  . Tubular adenoma of colon 02/19/2017  . Basal cell carcinoma of skin 12/01/2015  . Mixed hyperlipidemia 02/15/2015  . Atypical  migraine 02/15/2015  . Essential (primary) hypertension 02/15/2015  . Environmental and seasonal allergies 02/15/2015  . Sinus congestion 02/15/2015    Allergies   Allergen Reactions  . Augmentin [Amoxicillin-Pot Clavulanate] Nausea And Vomiting    Severe bloating and painful gas pains  . Ace Inhibitors Cough    Lisinopril   . Grass Extracts [Gramineae Pollens]   . Molds & Smuts     Past Surgical History:  Procedure Laterality Date  . APPENDECTOMY    . COLONOSCOPY  02/19/2017   tubular adenoma - repeat 5 yr  . skin cancer removal Right 10/2015   face/ jaw line    Social History   Tobacco Use  . Smoking status: Never Smoker  . Smokeless tobacco: Never Used  Vaping Use  . Vaping Use: Never used  Substance Use Topics  . Alcohol use: Yes    Alcohol/week: 2.0 standard drinks    Types: 2 Standard drinks or equivalent per week    Comment: rarely  . Drug use: No     Medication list has been reviewed and updated.  Current Meds  Medication Sig  . ALPRAZolam (XANAX) 0.5 MG tablet alprazolam 0.5 mg tablet  TK 1 T PO TID  . azelastine (ASTELIN) 0.1 % nasal spray   . ESTROGENS CONJUGATED PO Take 1 tablet by mouth daily.  . fluticasone (FLONASE) 50 MCG/ACT nasal spray Place 2 sprays into both nostrils daily.  Marland Kitchen JAIMIESS 0.15-0.03 &0.01 MG tablet   . losartan (COZAAR) 50 MG tablet TAKE 1 TABLET BY MOUTH  DAILY  . Melatonin 10 MG CAPS Take 10 mg by mouth daily.  . meloxicam (MOBIC) 7.5 MG tablet PRN  . tretinoin (RETIN-A) 0.05 % cream PRN  . zolmitriptan (ZOMIG) 5 MG tablet TAKE 1 TABLET BY MOUTH AS NEEDED    PHQ 2/9 Scores 02/05/2020 01/29/2019 01/16/2018  PHQ - 2 Score 0 0 0  PHQ- 9 Score 1 - -    GAD 7 : Generalized Anxiety Score 02/05/2020  Nervous, Anxious, on Edge 0  Control/stop worrying 0  Worry too much - different things 0  Trouble relaxing 0  Restless 0  Easily annoyed or irritable 0  Afraid - awful might happen 0  Total GAD 7 Score 0  Anxiety Difficulty Not difficult at all    BP Readings from Last 3 Encounters:  02/05/20 128/78  01/29/19 122/74  01/16/18 126/80    Physical Exam Vitals and nursing note reviewed.   Constitutional:      General: She is not in acute distress.    Appearance: She is well-developed.  HENT:     Head: Normocephalic and atraumatic.     Right Ear: Tympanic membrane and ear canal normal.     Left Ear: Tympanic membrane and ear canal normal.     Nose:     Right Sinus: No maxillary sinus tenderness.     Left Sinus: No maxillary sinus tenderness.  Eyes:     General: No scleral icterus.       Right eye: No discharge.        Left eye: No discharge.     Conjunctiva/sclera: Conjunctivae normal.  Neck:     Thyroid: No thyromegaly.     Vascular: No carotid bruit.  Cardiovascular:     Rate and Rhythm: Normal rate and regular rhythm.     Pulses: Normal pulses.     Heart sounds: Normal heart sounds.  Pulmonary:     Effort: Pulmonary effort is normal. No respiratory  distress.     Breath sounds: No wheezing.  Abdominal:     General: Bowel sounds are normal.     Palpations: Abdomen is soft.     Tenderness: There is no abdominal tenderness.  Musculoskeletal:     Cervical back: Normal range of motion. No erythema.     Right lower leg: No edema.     Left lower leg: No edema.  Lymphadenopathy:     Cervical: No cervical adenopathy.  Skin:    General: Skin is warm and dry.     Findings: No rash.  Neurological:     Mental Status: She is alert and oriented to person, place, and time.     Cranial Nerves: No cranial nerve deficit.     Sensory: No sensory deficit.     Deep Tendon Reflexes: Reflexes are normal and symmetric.  Psychiatric:        Attention and Perception: Attention normal.        Mood and Affect: Mood normal.     Wt Readings from Last 3 Encounters:  02/05/20 129 lb (58.5 kg)  01/29/19 128 lb (58.1 kg)  01/16/18 130 lb (59 kg)   Waist 31.5 in. BP 128/78   Pulse 69   Temp 97.8 F (36.6 C) (Oral)   Ht 5' 1.5" (1.562 m)   Wt 129 lb (58.5 kg)   SpO2 100%   BMI 23.98 kg/m   Assessment and Plan: 1. Annual physical exam Normal exam Continue healthy  diet, regular exercise Biometric form for work will be completed - POCT urinalysis dipstick - Nicotine screen, urine  2. Encounter for screening mammogram for breast cancer Ordered by GYN - done at [facility] 07/2019  3. Need for hepatitis C screening test - Hepatitis C antibody  4. Essential (primary) hypertension Clinically stable exam with well controlled BP on losartan. Tolerating medications without side effects at this time. Pt to continue current regimen and low sodium diet; benefits of regular exercise as able discussed. - CBC with Differential/Platelet - Comprehensive metabolic panel - TSH - losartan (COZAAR) 50 MG tablet; Take 1 tablet (50 mg total) by mouth daily.  Dispense: 90 tablet; Refill: 3  5. Atypical migraine Intermittent headaches relieved by Zomig No red flag sx noted  6. Mixed hyperlipidemia - Lipid panel  7. Basal cell carcinoma (BCC) of skin of other part of torso Followed annually by Dermatology  8. Menopause syndrome Mild symptoms Taking estroven and melatonin   Partially dictated using Editor, commissioning. Any errors are unintentional.  Halina Maidens, MD [facility]  02/05/2020

## 2020-02-06 ENCOUNTER — Telehealth: Payer: Self-pay | Admitting: Internal Medicine

## 2020-02-06 LAB — COMPREHENSIVE METABOLIC PANEL
ALT: 79 IU/L — ABNORMAL HIGH (ref 0–32)
AST: 48 IU/L — ABNORMAL HIGH (ref 0–40)
Albumin/Globulin Ratio: 1.7 (ref 1.2–2.2)
Albumin: 4.6 g/dL (ref 3.8–4.9)
Alkaline Phosphatase: 68 IU/L (ref 48–121)
BUN/Creatinine Ratio: 18 (ref 9–23)
BUN: 14 mg/dL (ref 6–24)
Bilirubin Total: 0.7 mg/dL (ref 0.0–1.2)
CO2: 25 mmol/L (ref 20–29)
Calcium: 9.7 mg/dL (ref 8.7–10.2)
Chloride: 101 mmol/L (ref 96–106)
Creatinine, Ser: 0.76 mg/dL (ref 0.57–1.00)
GFR calc Af Amer: 104 mL/min/{1.73_m2} (ref >59)
GFR calc non Af Amer: 90 mL/min/{1.73_m2} (ref >59)
Globulin, Total: 2.7 g/dL (ref 1.5–4.5)
Glucose: 93 mg/dL (ref 65–99)
Potassium: 4.7 mmol/L (ref 3.5–5.2)
Sodium: 141 mmol/L (ref 134–144)
Total Protein: 7.3 g/dL (ref 6.0–8.5)

## 2020-02-06 LAB — CBC WITH DIFFERENTIAL/PLATELET
Basophils Absolute: 0.1 10*3/uL (ref 0.0–0.2)
Basos: 1 %
EOS (ABSOLUTE): 0.2 10*3/uL (ref 0.0–0.4)
Eos: 3 %
Hematocrit: 43.9 % (ref 34.0–46.6)
Hemoglobin: 14.4 g/dL (ref 11.1–15.9)
Immature Grans (Abs): 0 10*3/uL (ref 0.0–0.1)
Immature Granulocytes: 0 %
Lymphocytes Absolute: 2.3 10*3/uL (ref 0.7–3.1)
Lymphs: 38 %
MCH: 30.3 pg (ref 26.6–33.0)
MCHC: 32.8 g/dL (ref 31.5–35.7)
MCV: 92 fL (ref 79–97)
Monocytes Absolute: 0.3 10*3/uL (ref 0.1–0.9)
Monocytes: 5 %
Neutrophils Absolute: 3.3 10*3/uL (ref 1.4–7.0)
Neutrophils: 53 %
Platelets: 240 10*3/uL (ref 150–450)
RBC: 4.76 x10E6/uL (ref 3.77–5.28)
RDW: 12.2 % (ref 11.7–15.4)
WBC: 6.2 10*3/uL (ref 3.4–10.8)

## 2020-02-06 LAB — TSH: TSH: 1.49 u[IU]/mL (ref 0.450–4.500)

## 2020-02-06 LAB — LIPID PANEL
Chol/HDL Ratio: 4.5 ratio — ABNORMAL HIGH (ref 0.0–4.4)
Cholesterol, Total: 232 mg/dL — ABNORMAL HIGH (ref 100–199)
HDL: 52 mg/dL (ref >39)
LDL Chol Calc (NIH): 146 mg/dL — ABNORMAL HIGH (ref 0–99)
Triglycerides: 191 mg/dL — ABNORMAL HIGH (ref 0–149)
VLDL Cholesterol Cal: 34 mg/dL (ref 5–40)

## 2020-02-06 LAB — NICOTINE SCREEN, URINE: Cotinine Ql Scrn, Ur: NEGATIVE ng/mL

## 2020-02-06 LAB — HEPATITIS C ANTIBODY: Hep C Virus Ab: <0.1 s/co ratio (ref 0.0–0.9)

## 2020-02-06 NOTE — Telephone Encounter (Signed)
Copied from [place] [id number]. Topic: General - Other >> Feb 06, 2020 11:24 AM Lennox Solders wrote: Reason for CRM: pt will need lab order put in system for liver panel. Pt will go to lab in oct 2021

## 2020-02-06 NOTE — Telephone Encounter (Signed)
Please call and schedule lab appt for pt. But she can come any time between. 8-12 and 1-4 to get her labs done.  Thank you,  KP

## 2020-05-24 ENCOUNTER — Other Ambulatory Visit: Payer: Self-pay

## 2020-05-24 ENCOUNTER — Encounter: Payer: Self-pay | Admitting: Internal Medicine

## 2020-05-24 DIAGNOSIS — R7989 Other specified abnormal findings of blood chemistry: Secondary | ICD-10-CM

## 2020-05-28 LAB — HEPATIC FUNCTION PANEL
ALT: 25 IU/L (ref 0–32)
AST: 20 IU/L (ref 0–40)
Albumin: 4.2 g/dL (ref 3.8–4.9)
Alkaline Phosphatase: 89 IU/L (ref 44–121)
Bilirubin Total: 0.3 mg/dL (ref 0.0–1.2)
Bilirubin, Direct: <0.1 mg/dL (ref 0.00–0.40)
Total Protein: 6.9 g/dL (ref 6.0–8.5)

## 2020-11-29 ENCOUNTER — Encounter: Payer: Self-pay | Admitting: Internal Medicine

## 2020-11-29 ENCOUNTER — Ambulatory Visit: Payer: Commercial Managed Care - PPO | Admitting: Internal Medicine

## 2020-11-29 ENCOUNTER — Other Ambulatory Visit: Payer: Self-pay

## 2020-11-29 VITALS — BP 118/78 | HR 53 | Temp 97.4°F | Ht 61.5 in | Wt 123.0 lb

## 2020-11-29 DIAGNOSIS — N3281 Overactive bladder: Secondary | ICD-10-CM | POA: Diagnosis not present

## 2020-11-29 DIAGNOSIS — N390 Urinary tract infection, site not specified: Secondary | ICD-10-CM

## 2020-11-29 LAB — POCT URINALYSIS DIPSTICK
Bilirubin, UA: NEGATIVE
Blood, UA: NEGATIVE
Glucose, UA: NEGATIVE
Ketones, UA: NEGATIVE
Leukocytes, UA: NEGATIVE
Nitrite, UA: NEGATIVE
Protein, UA: NEGATIVE
Spec Grav, UA: 1.025 (ref 1.010–1.025)
Urobilinogen, UA: 0.2 E.U./dL
pH, UA: 5 (ref 5.0–8.0)

## 2020-11-29 NOTE — Progress Notes (Signed)
Date:  11/29/2020   Name:  Brenda Keith   DOB:  [birth year]   MRN:  [id number]   Chief Complaint: Urinary Tract Infection (X1 month,went to UC 4/1 for UTI and positive for E coli took macrobid, after taking macrobid it went away a little but still had symptoms, had yeast infection due to meds, bladder fullness,since of urgency, lower back pain )  Urinary Tract Infection  This is a recurrent (treated for E Coli - pan sensitive - with macrobid) problem. Associated symptoms include frequency and urgency. Pertinent negatives include no chills or hematuria.    Lab Results  Component Value Date   CREATININE 0.76 02/05/2020   BUN 14 02/05/2020   NA 141 02/05/2020   K 4.7 02/05/2020   CL 101 02/05/2020   CO2 25 02/05/2020   Lab Results  Component Value Date   CHOL 232 (H) 02/05/2020   HDL 52 02/05/2020   LDLCALC 146 (H) 02/05/2020   TRIG 191 (H) 02/05/2020   CHOLHDL 4.5 (H) 02/05/2020   Lab Results  Component Value Date   TSH 1.490 02/05/2020   No results found for: HGBA1C Lab Results  Component Value Date   WBC 6.2 02/05/2020   HGB 14.4 02/05/2020   HCT 43.9 02/05/2020   MCV 92 02/05/2020   PLT 240 02/05/2020   Lab Results  Component Value Date   ALT 25 05/27/2020   AST 20 05/27/2020   ALKPHOS 89 05/27/2020   BILITOT 0.3 05/27/2020     Review of Systems  Constitutional: Negative for chills.  Respiratory: Negative for chest tightness and shortness of breath.   Gastrointestinal: Negative for abdominal pain, constipation and diarrhea.  Genitourinary: Positive for frequency and urgency. Negative for difficulty urinating, dysuria and hematuria.    Patient Active Problem List   Diagnosis Date Noted  . Menopause syndrome 02/05/2020  . Primary osteoarthritis of first carpometacarpal joint of one hand, left 11/06/2019  . Tubular adenoma of colon 02/19/2017  . Basal cell carcinoma of skin 12/01/2015  . Mixed hyperlipidemia 02/15/2015  . Atypical migraine 02/15/2015  .  Essential (primary) hypertension 02/15/2015  . Environmental and seasonal allergies 02/15/2015  . Sinus congestion 02/15/2015    Allergies  Allergen Reactions  . Ace Inhibitors Cough    Lisinopril   . Augmentin [Amoxicillin-Pot Clavulanate] Nausea And Vomiting    Severe bloating and painful gas pains  . Bacitracin Other (See Comments)  . Grass Extracts [Gramineae Pollens]   . Molds & Smuts   . Hydrocortisone Other (See Comments)    Past Surgical History:  Procedure Laterality Date  . APPENDECTOMY    . COLONOSCOPY  02/19/2017   tubular adenoma - repeat 5 yr  . skin cancer removal Right 10/2015   face/ jaw line    Social History   Tobacco Use  . Smoking status: Never Smoker  . Smokeless tobacco: Never Used  Vaping Use  . Vaping Use: Never used  Substance Use Topics  . Alcohol use: Yes    Alcohol/week: 2.0 standard drinks    Types: 2 Standard drinks or equivalent per week    Comment: rarely  . Drug use: No     Medication list has been reviewed and updated.  Current Meds  Medication Sig  . ALPRAZolam (XANAX) 0.5 MG tablet alprazolam 0.5 mg tablet  TK 1 T PO TID  . azelastine (ASTELIN) 0.1 % nasal spray   . D-MANNOSE PO Take by mouth.  . estradiol (VIVELLE-DOT) 0.1 MG/24HR patch 1  patch 2 (two) times a week.  . fluticasone (FLONASE) 50 MCG/ACT nasal spray Place 2 sprays into both nostrils daily.  Marland Kitchen losartan (COZAAR) 50 MG tablet Take 1 tablet (50 mg total) by mouth daily.  . meloxicam (MOBIC) 7.5 MG tablet PRN  . Pilocarpine HCl (VUITY) 1.25 % SOLN Vuity 1.25 % eye drops  INSTILL 1 DROP IN BOTH EYES EVERY DAY  . progesterone (PROMETRIUM) 100 MG capsule Take 100 mg by mouth at bedtime.  . tretinoin (RETIN-A) 0.05 % cream PRN  . zolmitriptan (ZOMIG) 5 MG tablet TAKE 1 TABLET BY MOUTH AS NEEDED  . [DISCONTINUED] ESTROGENS CONJUGATED PO Take 1 tablet by mouth daily.  . [DISCONTINUED] UNABLE TO FIND Med Name: demonnose    [facility] 2/9 Scores 11/29/2020 02/05/2020 01/29/2019  01/16/2018  PHQ - 2 Score 0 0 0 0  PHQ- 9 Score 0 1 - -    GAD 7 : Generalized Anxiety Score 11/29/2020 02/05/2020  Nervous, Anxious, on Edge 0 0  Control/stop worrying 0 0  Worry too much - different things 0 0  Trouble relaxing 0 0  Restless 0 0  Easily annoyed or irritable 0 0  Afraid - awful might happen 0 0  Total GAD 7 Score 0 0  Anxiety Difficulty Not difficult at all Not difficult at all    BP Readings from Last 3 Encounters:  11/29/20 118/78  02/05/20 128/78  01/29/19 122/74    Physical Exam Vitals and nursing note reviewed.  Constitutional:      Appearance: Normal appearance. She is well-developed.  Cardiovascular:     Rate and Rhythm: Normal rate and regular rhythm.     Heart sounds: Normal heart sounds.  Pulmonary:     Effort: Pulmonary effort is normal. No respiratory distress.     Breath sounds: Normal breath sounds.  Abdominal:     General: Bowel sounds are normal.     Palpations: Abdomen is soft.     Tenderness: There is no abdominal tenderness. There is no guarding or rebound.  Neurological:     General: No focal deficit present.     Mental Status: She is alert.     Wt Readings from Last 3 Encounters:  11/29/20 123 lb (55.8 kg)  02/05/20 129 lb (58.5 kg)  01/29/19 128 lb (58.1 kg)    BP 118/78   Pulse (!) 53   Temp (!) 97.4 F (36.3 C) (Oral)   Ht 5' 1.5" (1.562 m)   Wt 123 lb (55.8 kg)   SpO2 100%   BMI 22.86 kg/m   Assessment and Plan: 1. Overactive bladder Samples of Toviaz 4 mg given  2. Recurrent UTI (urinary tract infection) UA negative. - POCT urinalysis dipstick   Partially dictated using Editor, commissioning. Any errors are unintentional.  Halina Maidens, MD [facility]  11/29/2020

## 2020-11-29 NOTE — Patient Instructions (Signed)
Toviaz 4 mg once a day - in the morning

## 2020-11-30 ENCOUNTER — Ambulatory Visit: Payer: Commercial Managed Care - PPO | Admitting: Internal Medicine

## 2020-12-02 ENCOUNTER — Telehealth: Payer: Self-pay | Admitting: Internal Medicine

## 2020-12-02 ENCOUNTER — Other Ambulatory Visit: Payer: Self-pay

## 2020-12-02 MED ORDER — FESOTERODINE FUMARATE ER 4 MG PO TB24: 4.0000 mg | ORAL_TABLET | Freq: Every day | ORAL | 1 refills | Status: DC

## 2020-12-02 NOTE — Telephone Encounter (Signed)
Pt was given a sample of toviaz 4 mg and would like new rx toviaz 4 mg send to [facility] [address] in [place] phone number [phone]

## 2020-12-02 NOTE — Telephone Encounter (Signed)
Sent prescription to [facility] with Dr. Gaspar Cola approval.

## 2021-02-07 ENCOUNTER — Encounter: Payer: Self-pay | Admitting: Internal Medicine

## 2021-02-07 ENCOUNTER — Ambulatory Visit (INDEPENDENT_AMBULATORY_CARE_PROVIDER_SITE_OTHER): Payer: Commercial Managed Care - PPO | Admitting: Internal Medicine

## 2021-02-07 ENCOUNTER — Other Ambulatory Visit: Payer: Self-pay

## 2021-02-07 VITALS — BP 112/68 | HR 67 | Temp 97.7°F | Ht 61.5 in | Wt 122.0 lb

## 2021-02-07 DIAGNOSIS — E782 Mixed hyperlipidemia: Secondary | ICD-10-CM

## 2021-02-07 DIAGNOSIS — Z0000 Encounter for general adult medical examination without abnormal findings: Secondary | ICD-10-CM

## 2021-02-07 DIAGNOSIS — I10 Essential (primary) hypertension: Secondary | ICD-10-CM

## 2021-02-07 DIAGNOSIS — G43009 Migraine without aura, not intractable, without status migrainosus: Secondary | ICD-10-CM | POA: Diagnosis not present

## 2021-02-07 DIAGNOSIS — Z1231 Encounter for screening mammogram for malignant neoplasm of breast: Secondary | ICD-10-CM

## 2021-02-07 LAB — POCT URINALYSIS DIPSTICK
Bilirubin, UA: NEGATIVE
Blood, UA: NEGATIVE
Glucose, UA: NEGATIVE
Ketones, UA: NEGATIVE
Leukocytes, UA: NEGATIVE
Nitrite, UA: NEGATIVE
Protein, UA: NEGATIVE
Spec Grav, UA: 1.025 (ref 1.010–1.025)
Urobilinogen, UA: 0.2 E.U./dL
pH, UA: 5 (ref 5.0–8.0)

## 2021-02-07 MED ORDER — LOSARTAN POTASSIUM 50 MG PO TABS: 50.0000 mg | ORAL_TABLET | Freq: Every day | ORAL | 3 refills | Status: DC

## 2021-02-07 NOTE — Progress Notes (Signed)
Date:  02/07/2021   Name:  Brenda Keith   DOB:  12-Jun-1966   MRN:  [id number]   Chief Complaint: Annual Exam (Breast exam no pap) Brenda Keith is a 55 y.o. female who presents today for her Complete Annual Exam. She feels well. She reports exercising running x 2-3 days a week, lift weights, and rowing machine. She reports she is sleeping well. Breast complaints none.  Recently seen by GYN.    Mammogram: 05/2020  @ [facility] DEXA: none Pap smear: 12/2018 at [facility] Colonoscopy: 01/2017  Immunization History  Administered Date(s) Administered   Influenza,inj,Quad PF,6+ Mos 05/05/2019   Influenza-Unspecified 04/30/2014, 05/01/2015, 04/30/2016, 04/30/2018, 05/12/2019, 04/23/2020   PFIZER(Purple Top)SARS-COV-2 Vaccination 10/17/2019, 11/12/2019   Tdap 02/13/2011   Unspecified SARS-COV-2 Vaccination 05/19/2020   Zoster Recombinat (Shingrix) 01/29/2019, 06/30/2019    Hypertension This is a chronic problem. The problem is controlled. Pertinent negatives include no chest pain, headaches, palpitations or shortness of breath. Past treatments include angiotensin blockers. The current treatment provides significant improvement. There are no compliance problems.   Migraine  This is a recurrent problem. The problem occurs seasonly (many months since the last migraine). Pertinent negatives include no abdominal pain, coughing, dizziness, fever, hearing loss, tinnitus or vomiting. Her past medical history is significant for hypertension.   Lab Results  Component Value Date   CREATININE 0.76 02/05/2020   BUN 14 02/05/2020   NA 141 02/05/2020   K 4.7 02/05/2020   CL 101 02/05/2020   CO2 25 02/05/2020   Lab Results  Component Value Date   CHOL 232 (H) 02/05/2020   HDL 52 02/05/2020   LDLCALC 146 (H) 02/05/2020   TRIG 191 (H) 02/05/2020   CHOLHDL 4.5 (H) 02/05/2020   Lab Results  Component Value Date   TSH 1.490 02/05/2020   No results found for: HGBA1C Lab Results  Component Value Date   WBC 6.2  02/05/2020   HGB 14.4 02/05/2020   HCT 43.9 02/05/2020   MCV 92 02/05/2020   PLT 240 02/05/2020   Lab Results  Component Value Date   ALT 25 05/27/2020   AST 20 05/27/2020   ALKPHOS 89 05/27/2020   BILITOT 0.3 05/27/2020     Review of Systems  Constitutional:  Negative for chills, fatigue and fever.  HENT:  Negative for congestion, hearing loss, tinnitus, trouble swallowing and voice change.   Eyes:  Negative for visual disturbance.  Respiratory:  Negative for cough, chest tightness, shortness of breath and wheezing.   Cardiovascular:  Negative for chest pain, palpitations and leg swelling.  Gastrointestinal:  Negative for abdominal pain, constipation, diarrhea and vomiting.  Endocrine: Negative for polydipsia and polyuria.  Genitourinary:  Negative for dysuria, frequency, genital sores, vaginal bleeding and vaginal discharge.  Musculoskeletal:  Negative for arthralgias, gait problem and joint swelling.  Skin:  Negative for color change and rash.  Neurological:  Negative for dizziness, tremors, light-headedness and headaches.  Hematological:  Negative for adenopathy. Does not bruise/bleed easily.  Psychiatric/Behavioral:  Negative for dysphoric mood and sleep disturbance. The patient is not nervous/anxious.    Patient Active Problem List   Diagnosis Date Noted   Menopause syndrome 02/05/2020   Primary osteoarthritis of first carpometacarpal joint of one hand, left 11/06/2019   Tubular adenoma of colon 02/19/2017   Basal cell carcinoma of skin 12/01/2015   Mixed hyperlipidemia 02/15/2015   Atypical migraine 02/15/2015   Essential (primary) hypertension 02/15/2015   Environmental and seasonal allergies 02/15/2015   Sinus congestion 02/15/2015  Allergies  Allergen Reactions   Ace Inhibitors Cough    Lisinopril    Augmentin [Amoxicillin-Pot Clavulanate] Nausea And Vomiting    Severe bloating and painful gas pains   Bacitracin Other (See Comments)   Grass Extracts  [Gramineae Pollens]    Molds & Smuts    Hydrocortisone Other (See Comments)    Past Surgical History:  Procedure Laterality Date   APPENDECTOMY     COLONOSCOPY  02/19/2017   tubular adenoma - repeat 5 yr   skin cancer removal Right 10/2015   face/ jaw line    Social History   Tobacco Use   Smoking status: Never   Smokeless tobacco: Never  Vaping Use   Vaping Use: Never used  Substance Use Topics   Alcohol use: Yes    Alcohol/week: 2.0 standard drinks    Types: 2 Standard drinks or equivalent per week    Comment: rarely   Drug use: No     Medication list has been reviewed and updated.  Current Meds  Medication Sig   ALPRAZolam (XANAX) 0.5 MG tablet alprazolam 0.5 mg tablet  TK 1 T PO TID   azelastine (ASTELIN) 0.1 % nasal spray    estradiol (VIVELLE-DOT) 0.1 MG/24HR patch 1 patch 2 (two) times a week.   fesoterodine (TOVIAZ) 4 MG TB24 tablet Take 1 tablet (4 mg total) by mouth daily. (Patient taking differently: Take 4 mg by mouth daily.)   fluticasone (FLONASE) 50 MCG/ACT nasal spray Place 2 sprays into both nostrils daily.   losartan (COZAAR) 50 MG tablet Take 1 tablet (50 mg total) by mouth daily.   meloxicam (MOBIC) 7.5 MG tablet PRN   Pilocarpine HCl (VUITY) 1.25 % SOLN Vuity 1.25 % eye drops  INSTILL 1 DROP IN BOTH EYES EVERY DAY   progesterone (PROMETRIUM) 100 MG capsule Take 100 mg by mouth at bedtime.   zolmitriptan (ZOMIG) 5 MG tablet TAKE 1 TABLET BY MOUTH AS NEEDED   [DISCONTINUED] D-MANNOSE PO Take by mouth.   [DISCONTINUED] tretinoin (RETIN-A) 0.05 % cream PRN    PHQ 2/9 Scores 11/29/2020 02/05/2020 01/29/2019 01/16/2018  PHQ - 2 Score 0 0 0 0  PHQ- 9 Score 0 1 - -    GAD 7 : Generalized Anxiety Score 11/29/2020 02/05/2020  Nervous, Anxious, on Edge 0 0  Control/stop worrying 0 0  Worry too much - different things 0 0  Trouble relaxing 0 0  Restless 0 0  Easily annoyed or irritable 0 0  Afraid - awful might happen 0 0  Total GAD 7 Score 0 0  Anxiety  Difficulty Not difficult at all Not difficult at all    BP Readings from Last 3 Encounters:  02/07/21 112/68  11/29/20 118/78  02/05/20 128/78    Physical Exam Vitals and nursing note reviewed.  Constitutional:      General: She is not in acute distress.    Appearance: She is well-developed.  HENT:     Head: Normocephalic and atraumatic.     Right Ear: Tympanic membrane and ear canal normal.     Left Ear: Tympanic membrane and ear canal normal.     Nose:     Right Sinus: No maxillary sinus tenderness.     Left Sinus: No maxillary sinus tenderness.  Eyes:     General: No scleral icterus.       Right eye: No discharge.        Left eye: No discharge.     Conjunctiva/sclera: Conjunctivae normal.  Neck:     Thyroid: No thyromegaly.     Vascular: No carotid bruit.  Cardiovascular:     Rate and Rhythm: Normal rate and regular rhythm.     Pulses: Normal pulses.     Heart sounds: Normal heart sounds.  Pulmonary:     Effort: Pulmonary effort is normal. No respiratory distress.     Breath sounds: No wheezing.  Abdominal:     General: Bowel sounds are normal.     Palpations: Abdomen is soft.     Tenderness: There is no abdominal tenderness.  Musculoskeletal:     Cervical back: Normal range of motion. No erythema.     Right lower leg: No edema.     Left lower leg: No edema.  Lymphadenopathy:     Cervical: No cervical adenopathy.  Skin:    General: Skin is warm and dry.     Findings: No rash.  Neurological:     Mental Status: She is alert and oriented to person, place, and time.     Cranial Nerves: No cranial nerve deficit.     Sensory: No sensory deficit.     Deep Tendon Reflexes: Reflexes are normal and symmetric.  Psychiatric:        Attention and Perception: Attention normal.        Mood and Affect: Mood normal.    Wt Readings from Last 3 Encounters:  02/07/21 122 lb (55.3 kg)  11/29/20 123 lb (55.8 kg)  02/05/20 129 lb (58.5 kg)    BP 112/68   Pulse 67   Temp  97.7 F (36.5 C) (Oral)   Ht 5' 1.5" (1.562 m)   Wt 122 lb (55.3 kg)   SpO2 100%   BMI 22.68 kg/m   Assessment and Plan: 1. Annual physical exam Normal exam Continue healthy diet and exercise Up to date on screenings and immunizations  2. Encounter for screening mammogram for breast cancer At [facility] - due in November  3. Essential (primary) hypertension Clinically stable exam with well controlled BP. Tolerating medications without side effects at this time. Pt to continue current regimen and low sodium diet; benefits of regular exercise as able discussed. - CBC with Differential/Platelet - Comprehensive metabolic panel - TSH - POCT urinalysis dipstick - losartan (COZAAR) 50 MG tablet; Take 1 tablet (50 mg total) by mouth daily.  Dispense: 90 tablet; Refill: 3  4. Mixed hyperlipidemia Check labs and advise - Lipid panel  5. Atypical migraine None recently Has Zomig to use as needed   Partially dictated using Editor, commissioning. Any errors are unintentional.  Halina Maidens, MD [facility]  02/07/2021

## 2021-02-08 LAB — LIPID PANEL
Chol/HDL Ratio: 3.7 ratio (ref 0.0–4.4)
Cholesterol, Total: 221 mg/dL — ABNORMAL HIGH (ref 100–199)
HDL: 59 mg/dL (ref >39)
LDL Chol Calc (NIH): 138 mg/dL — ABNORMAL HIGH (ref 0–99)
Triglycerides: 136 mg/dL (ref 0–149)
VLDL Cholesterol Cal: 24 mg/dL (ref 5–40)

## 2021-02-08 LAB — COMPREHENSIVE METABOLIC PANEL
ALT: 13 IU/L (ref 0–32)
AST: 17 IU/L (ref 0–40)
Albumin/Globulin Ratio: 1.9 (ref 1.2–2.2)
Albumin: 4.6 g/dL (ref 3.8–4.9)
Alkaline Phosphatase: 64 IU/L (ref 44–121)
BUN/Creatinine Ratio: 17 (ref 9–23)
BUN: 14 mg/dL (ref 6–24)
Bilirubin Total: 0.4 mg/dL (ref 0.0–1.2)
CO2: 23 mmol/L (ref 20–29)
Calcium: 9.2 mg/dL (ref 8.7–10.2)
Chloride: 103 mmol/L (ref 96–106)
Creatinine, Ser: 0.81 mg/dL (ref 0.57–1.00)
Globulin, Total: 2.4 g/dL (ref 1.5–4.5)
Glucose: 98 mg/dL (ref 65–99)
Potassium: 5 mmol/L (ref 3.5–5.2)
Sodium: 138 mmol/L (ref 134–144)
Total Protein: 7 g/dL (ref 6.0–8.5)
eGFR: 86 mL/min/{1.73_m2} (ref >59)

## 2021-02-08 LAB — CBC WITH DIFFERENTIAL/PLATELET
Basophils Absolute: 0.1 10*3/uL (ref 0.0–0.2)
Basos: 1 %
EOS (ABSOLUTE): 0.2 10*3/uL (ref 0.0–0.4)
Eos: 2 %
Hematocrit: 41.6 % (ref 34.0–46.6)
Hemoglobin: 14.2 g/dL (ref 11.1–15.9)
Immature Grans (Abs): 0 10*3/uL (ref 0.0–0.1)
Immature Granulocytes: 0 %
Lymphocytes Absolute: 2.8 10*3/uL (ref 0.7–3.1)
Lymphs: 36 %
MCH: 31.2 pg (ref 26.6–33.0)
MCHC: 34.1 g/dL (ref 31.5–35.7)
MCV: 91 fL (ref 79–97)
Monocytes Absolute: 0.4 10*3/uL (ref 0.1–0.9)
Monocytes: 5 %
Neutrophils Absolute: 4.4 10*3/uL (ref 1.4–7.0)
Neutrophils: 56 %
Platelets: 254 10*3/uL (ref 150–450)
RBC: 4.55 x10E6/uL (ref 3.77–5.28)
RDW: 12.1 % (ref 11.7–15.4)
WBC: 7.9 10*3/uL (ref 3.4–10.8)

## 2021-02-08 LAB — TSH: TSH: 1.65 u[IU]/mL (ref 0.450–4.500)

## 2021-02-13 ENCOUNTER — Other Ambulatory Visit: Payer: Self-pay | Admitting: Internal Medicine

## 2021-02-13 DIAGNOSIS — I10 Essential (primary) hypertension: Secondary | ICD-10-CM

## 2021-02-13 NOTE — Telephone Encounter (Signed)
last RF 02/18/21 #90  3 RF

## 2021-04-13 ENCOUNTER — Other Ambulatory Visit: Payer: Self-pay | Admitting: Internal Medicine

## 2021-04-13 ENCOUNTER — Encounter: Payer: Self-pay | Admitting: Internal Medicine

## 2021-04-13 DIAGNOSIS — T753XXA Motion sickness, initial encounter: Secondary | ICD-10-CM

## 2021-04-13 MED ORDER — SCOPOLAMINE 1 MG/3DAYS TD PT72: 1.0000 | MEDICATED_PATCH | TRANSDERMAL | 0 refills | Status: AC

## 2021-04-13 NOTE — Telephone Encounter (Signed)
Please review.  KP

## 2022-02-09 ENCOUNTER — Encounter: Payer: Commercial Managed Care - PPO | Admitting: Internal Medicine

## 2022-02-13 ENCOUNTER — Other Ambulatory Visit: Payer: Self-pay | Admitting: Internal Medicine

## 2022-02-13 DIAGNOSIS — I10 Essential (primary) hypertension: Secondary | ICD-10-CM

## 2022-02-14 NOTE — Telephone Encounter (Signed)
Requested medications are due for refill today.  yes  Requested medications are on the active medications list.  yes  Last refill. 02/07/2021 #90 3 refills  Future visit scheduled.   yes  Notes to clinic.  Labs are expired    Requested Prescriptions  Pending Prescriptions Disp Refills   losartan (COZAAR) 50 MG tablet [Pharmacy Med Name: LOSARTAN '50MG'$  TABLETS] 90 tablet 3    Sig: TAKE 1 TABLET(50 MG) BY MOUTH DAILY     Cardiovascular:  Angiotensin Receptor Blockers Failed - 02/13/2022  2:25 PM      Failed - Cr in normal range and within 180 days    Creatinine, Ser  Date Value Ref Range Status  02/07/2021 0.81 0.57 - 1.00 mg/dL Final         Failed - K in normal range and within 180 days    Potassium  Date Value Ref Range Status  02/07/2021 5.0 3.5 - 5.2 mmol/L Final         Failed - Valid encounter within last 6 months    Recent Outpatient Visits           1 year ago Annual physical exam   [facility] Glean Hess, MD   1 year ago Overactive bladder   [facility] Glean Hess, MD   2 years ago Annual physical exam   [facility] Glean Hess, MD   3 years ago Annual physical exam   [facility] Glean Hess, MD   4 years ago Annual physical exam   [facility] Glean Hess, MD       Future Appointments             In 1 month Glean Hess, MD [facility] [place] - Patient is not pregnant      Passed - Last BP in normal range    BP Readings from Last 1 Encounters:  02/07/21 112/68

## 2022-03-28 LAB — RESULTS CONSOLE HPV: CHL HPV: NEGATIVE

## 2022-03-28 LAB — HM PAP SMEAR: HM Pap smear: NORMAL

## 2022-03-29 ENCOUNTER — Ambulatory Visit (INDEPENDENT_AMBULATORY_CARE_PROVIDER_SITE_OTHER): Payer: Commercial Managed Care - PPO | Admitting: Internal Medicine

## 2022-03-29 ENCOUNTER — Encounter: Payer: Self-pay | Admitting: Internal Medicine

## 2022-03-29 VITALS — BP 112/74 | HR 60 | Ht 61.5 in | Wt 126.0 lb

## 2022-03-29 DIAGNOSIS — Z1211 Encounter for screening for malignant neoplasm of colon: Secondary | ICD-10-CM | POA: Diagnosis not present

## 2022-03-29 DIAGNOSIS — Z131 Encounter for screening for diabetes mellitus: Secondary | ICD-10-CM

## 2022-03-29 DIAGNOSIS — Z0000 Encounter for general adult medical examination without abnormal findings: Secondary | ICD-10-CM

## 2022-03-29 DIAGNOSIS — I10 Essential (primary) hypertension: Secondary | ICD-10-CM

## 2022-03-29 DIAGNOSIS — Z1231 Encounter for screening mammogram for malignant neoplasm of breast: Secondary | ICD-10-CM

## 2022-03-29 DIAGNOSIS — E782 Mixed hyperlipidemia: Secondary | ICD-10-CM

## 2022-03-29 MED ORDER — LOSARTAN POTASSIUM 50 MG PO TABS: 50.0000 mg | ORAL_TABLET | Freq: Every day | ORAL | 1 refills | Status: DC

## 2022-03-29 NOTE — Progress Notes (Signed)
Date:  03/29/2022   Name:  Brenda Keith   DOB:  04-02-1966   MRN:  [id number]   Chief Complaint: Annual Exam (No Breast exam no pap - had breast exam at gyn yesterday 03/28/22) Brenda Keith is a 56 y.o. female who presents today for her Complete Annual Exam. She feels well. She reports exercising running X 3-4 days a week. She reports she is sleeping well. Breast complaints none.  Mammogram: 05/2020 ([facility]) DEXA: none Pap smear: 12/2018 neg thin prep (GYN) Colonoscopy: 01/2017 repeat 3 yrs - [facility] scheduled  Health Maintenance Due  Topic Date Due   TETANUS/TDAP  02/12/2021   MAMMOGRAM  06/23/2021   COVID-19 Vaccine (5 - Mixed Product risk series) 08/16/2021   COLONOSCOPY (Pts 45-70yrs Insurance coverage will need to be confirmed)  02/19/2022   INFLUENZA VACCINE  02/28/2022    Immunization History  Administered Date(s) Administered   Influenza,inj,Quad PF,6+ Mos 05/05/2019   Influenza-Unspecified 04/30/2014, 05/01/2015, 04/30/2016, 04/30/2018, 05/12/2019, 04/23/2020, 05/07/2021   PFIZER(Purple Top)SARS-COV-2 Vaccination 10/17/2019, 11/12/2019   Tdap 02/13/2011   Unspecified SARS-COV-2 Vaccination 05/19/2020, 06/21/2021   Zoster Recombinat (Shingrix) 01/29/2019, 06/30/2019    Hypertension This is a chronic problem. The problem is controlled. Pertinent negatives include no chest pain, headaches, palpitations or shortness of breath. Past treatments include angiotensin blockers. There is no history of kidney disease, CAD/MI or CVA.  Migraine  The problem has been resolved. Pertinent negatives include no abdominal pain, coughing, dizziness, fever, hearing loss, tinnitus or vomiting. She has tried triptans (still has medication to take if needed) for the symptoms. Her past medical history is significant for hypertension.    Lab Results  Component Value Date   NA 138 02/07/2021   K 5.0 02/07/2021   CO2 23 02/07/2021   GLUCOSE 98 02/07/2021   BUN 14 02/07/2021   CREATININE 0.81  02/07/2021   CALCIUM 9.2 02/07/2021   EGFR 86 02/07/2021   GFRNONAA 90 02/05/2020   Lab Results  Component Value Date   CHOL 221 (H) 02/07/2021   HDL 59 02/07/2021   LDLCALC 138 (H) 02/07/2021   TRIG 136 02/07/2021   CHOLHDL 3.7 02/07/2021   Lab Results  Component Value Date   TSH 1.650 02/07/2021   No results found for: "HGBA1C" Lab Results  Component Value Date   WBC 7.9 02/07/2021   HGB 14.2 02/07/2021   HCT 41.6 02/07/2021   MCV 91 02/07/2021   PLT 254 02/07/2021   Lab Results  Component Value Date   ALT 13 02/07/2021   AST 17 02/07/2021   ALKPHOS 64 02/07/2021   BILITOT 0.4 02/07/2021   No results found for: "25OHVITD2", "25OHVITD3", "VD25OH"   Review of Systems  Constitutional:  Negative for chills, fatigue and fever.  HENT:  Positive for dental problem. Negative for congestion, hearing loss, tinnitus, trouble swallowing and voice change.   Eyes:  Negative for visual disturbance.  Respiratory:  Negative for cough, chest tightness, shortness of breath and wheezing.   Cardiovascular:  Negative for chest pain, palpitations and leg swelling.  Gastrointestinal:  Negative for abdominal pain, constipation, diarrhea and vomiting.  Endocrine: Negative for polydipsia and polyuria.  Genitourinary:  Negative for dysuria, frequency, genital sores, vaginal bleeding and vaginal discharge.  Musculoskeletal:  Negative for arthralgias, gait problem and joint swelling.  Skin:  Negative for color change and rash.       Recurrent basal cell skin cancer  Neurological:  Negative for dizziness, tremors, light-headedness and headaches.  Hematological:  Negative for adenopathy. Does not bruise/bleed easily.  Psychiatric/Behavioral:  Negative for dysphoric mood and sleep disturbance. The patient is not nervous/anxious.     Patient Active Problem List   Diagnosis Date Noted   Menopause syndrome 02/05/2020   Primary osteoarthritis of first carpometacarpal joint of one hand, left  11/06/2019   Tubular adenoma of colon 02/19/2017   Basal cell carcinoma of skin 12/01/2015   Mixed hyperlipidemia 02/15/2015   Atypical migraine 02/15/2015   Essential (primary) hypertension 02/15/2015   Environmental and seasonal allergies 02/15/2015   Sinus congestion 02/15/2015    Allergies  Allergen Reactions   Ace Inhibitors Cough    Lisinopril    Augmentin [Amoxicillin-Pot Clavulanate] Nausea And Vomiting    Severe bloating and painful gas pains   Bacitracin Other (See Comments)   Grass Extracts [Gramineae Pollens]    Molds & Smuts    Hydrocortisone Other (See Comments)    Past Surgical History:  Procedure Laterality Date   APPENDECTOMY     COLONOSCOPY  02/19/2017   tubular adenoma - repeat 5 yr   skin cancer removal Right 10/2015   face/ jaw line    Social History   Tobacco Use   Smoking status: Never   Smokeless tobacco: Never  Vaping Use   Vaping Use: Never used  Substance Use Topics   Alcohol use: Yes    Alcohol/week: 2.0 standard drinks of alcohol    Types: 2 Standard drinks or equivalent per week    Comment: rarely   Drug use: No     Medication list has been reviewed and updated.  Current Meds  Medication Sig   azelastine (ASTELIN) 0.1 % nasal spray    estradiol (VIVELLE-DOT) 0.1 MG/24HR patch 1 patch 2 (two) times a week.   fluticasone (FLONASE) 50 MCG/ACT nasal spray Place 2 sprays into both nostrils daily.   losartan (COZAAR) 50 MG tablet TAKE 1 TABLET(50 MG) BY MOUTH DAILY   meloxicam (MOBIC) 7.5 MG tablet as needed. PRN   progesterone (PROMETRIUM) 100 MG capsule Take 100 mg by mouth at bedtime.   scopolamine (TRANSDERM-SCOP, 1.5 MG,) 1 MG/3DAYS Place 1 patch (1.5 mg total) onto the skin every 3 (three) days.       03/29/2022   10:01 AM 02/07/2021    8:06 AM 11/29/2020   10:59 AM 02/05/2020    8:29 AM  GAD 7 : Generalized Anxiety Score  Nervous, Anxious, on Edge 0 0 0 0  Control/stop worrying 0 0 0 0  Worry too much - different things 0 0  0 0  Trouble relaxing 0 0 0 0  Restless 0 0 0 0  Easily annoyed or irritable 0 0 0 0  Afraid - awful might happen 0 0 0 0  Total GAD 7 Score 0 0 0 0  Anxiety Difficulty Not difficult at all  Not difficult at all Not difficult at all       03/29/2022   10:01 AM 02/07/2021    8:06 AM 11/29/2020   10:59 AM  Depression screen PHQ 2/9  Decreased Interest 0 0 0  Down, Depressed, Hopeless 0 0 0  PHQ - 2 Score 0 0 0  Altered sleeping 0 0 0  Tired, decreased energy 0 0 0  Change in appetite 0 0 0  Feeling bad or failure about yourself  0 0 0  Trouble concentrating 0 0 0  Moving slowly or fidgety/restless 0 0 0  Suicidal thoughts 0 0 0  PHQ-9 Score 0  0 0  Difficult doing work/chores Not difficult at all Not difficult at all Not difficult at all    BP Readings from Last 3 Encounters:  03/29/22 112/74  02/07/21 112/68  11/29/20 118/78    Physical Exam Vitals and nursing note reviewed.  Constitutional:      General: She is not in acute distress.    Appearance: She is well-developed.  HENT:     Head: Normocephalic and atraumatic.     Right Ear: Tympanic membrane and ear canal normal.     Left Ear: Tympanic membrane and ear canal normal.     Nose:     Right Sinus: No maxillary sinus tenderness.     Left Sinus: No maxillary sinus tenderness.  Eyes:     General: No scleral icterus.       Right eye: No discharge.        Left eye: No discharge.     Conjunctiva/sclera: Conjunctivae normal.  Neck:     Thyroid: No thyromegaly.     Vascular: No carotid bruit.  Cardiovascular:     Rate and Rhythm: Normal rate and regular rhythm.     Pulses: Normal pulses.     Heart sounds: Normal heart sounds.  Pulmonary:     Effort: Pulmonary effort is normal. No respiratory distress.     Breath sounds: No wheezing.  Chest:     Comments: Declined - done yesterday at GYN Abdominal:     General: Bowel sounds are normal.     Palpations: Abdomen is soft.     Tenderness: There is no abdominal  tenderness.  Musculoskeletal:     Cervical back: Normal range of motion. No erythema.     Right lower leg: No edema.     Left lower leg: No edema.  Lymphadenopathy:     Cervical: No cervical adenopathy.  Skin:    General: Skin is warm and dry.     Findings: No rash.  Neurological:     Mental Status: She is alert and oriented to person, place, and time.     Cranial Nerves: No cranial nerve deficit.     Sensory: No sensory deficit.     Deep Tendon Reflexes: Reflexes are normal and symmetric.  Psychiatric:        Attention and Perception: Attention normal.        Mood and Affect: Mood normal.     Wt Readings from Last 3 Encounters:  03/29/22 126 lb (57.2 kg)  02/07/21 122 lb (55.3 kg)  11/29/20 123 lb (55.8 kg)    BP 112/74   Pulse 60   Ht 5' 1.5" (1.562 m)   Wt 126 lb (57.2 kg)   SpO2 99%   BMI 23.42 kg/m   Assessment and Plan: 1. Annual physical exam Normal exam Continue healthy diet and exercise - CBC with Differential/Platelet - Comprehensive metabolic panel - Hemoglobin A1c - Lipid panel - TSH - Urinalysis, Routine w reflex microscopic  2. Encounter for screening mammogram for breast cancer Done at [facility] - will request report  3. Colon cancer screening Scheduled for this fall with Dr. Comer Locket  4. Essential (primary) hypertension Clinically stable exam with well controlled BP. Tolerating medications without side effects at this time. Pt to continue current regimen and low sodium diet and regular exercise - CBC with Differential/Platelet - Comprehensive metabolic panel - TSH - Urinalysis, Routine w reflex microscopic - losartan (COZAAR) 50 MG tablet; Take 1 tablet (50 mg total) by mouth daily.  Dispense:  90 tablet; Refill: 1  5. Mixed hyperlipidemia Check labs and advise - Lipid panel  6. Screening for diabetes mellitus - Hemoglobin A1c   Partially dictated using Editor, commissioning. Any errors are unintentional.  Halina Maidens, MD [facility]  03/29/2022

## 2022-03-30 LAB — COMPREHENSIVE METABOLIC PANEL
ALT: 15 IU/L (ref 0–32)
AST: 18 IU/L (ref 0–40)
Albumin/Globulin Ratio: 1.8 (ref 1.2–2.2)
Albumin: 4.6 g/dL (ref 3.8–4.9)
Alkaline Phosphatase: 57 IU/L (ref 44–121)
BUN/Creatinine Ratio: 12 (ref 9–23)
BUN: 10 mg/dL (ref 6–24)
Bilirubin Total: 0.4 mg/dL (ref 0.0–1.2)
CO2: 22 mmol/L (ref 20–29)
Calcium: 9.9 mg/dL (ref 8.7–10.2)
Chloride: 104 mmol/L (ref 96–106)
Creatinine, Ser: 0.81 mg/dL (ref 0.57–1.00)
Globulin, Total: 2.5 g/dL (ref 1.5–4.5)
Glucose: 98 mg/dL (ref 70–99)
Potassium: 4.5 mmol/L (ref 3.5–5.2)
Sodium: 141 mmol/L (ref 134–144)
Total Protein: 7.1 g/dL (ref 6.0–8.5)
eGFR: 85 mL/min/{1.73_m2} (ref >59)

## 2022-03-30 LAB — TSH: TSH: 1.05 u[IU]/mL (ref 0.450–4.500)

## 2022-03-30 LAB — URINALYSIS, ROUTINE W REFLEX MICROSCOPIC
Bilirubin, UA: NEGATIVE
Glucose, UA: NEGATIVE
Ketones, UA: NEGATIVE
Leukocytes,UA: NEGATIVE
Nitrite, UA: NEGATIVE
Protein,UA: NEGATIVE
RBC, UA: NEGATIVE
Specific Gravity, UA: 1.012 (ref 1.005–1.030)
Urobilinogen, Ur: 0.2 mg/dL (ref 0.2–1.0)
pH, UA: 5.5 (ref 5.0–7.5)

## 2022-03-30 LAB — CBC WITH DIFFERENTIAL/PLATELET
Basophils Absolute: 0.1 10*3/uL (ref 0.0–0.2)
Basos: 1 %
EOS (ABSOLUTE): 0.2 10*3/uL (ref 0.0–0.4)
Eos: 3 %
Hematocrit: 40.5 % (ref 34.0–46.6)
Hemoglobin: 14.3 g/dL (ref 11.1–15.9)
Immature Grans (Abs): 0 10*3/uL (ref 0.0–0.1)
Immature Granulocytes: 0 %
Lymphocytes Absolute: 2.4 10*3/uL (ref 0.7–3.1)
Lymphs: 35 %
MCH: 31.9 pg (ref 26.6–33.0)
MCHC: 35.3 g/dL (ref 31.5–35.7)
MCV: 90 fL (ref 79–97)
Monocytes Absolute: 0.3 10*3/uL (ref 0.1–0.9)
Monocytes: 4 %
Neutrophils Absolute: 3.8 10*3/uL (ref 1.4–7.0)
Neutrophils: 57 %
Platelets: 256 10*3/uL (ref 150–450)
RBC: 4.48 x10E6/uL (ref 3.77–5.28)
RDW: 12.5 % (ref 11.7–15.4)
WBC: 6.8 10*3/uL (ref 3.4–10.8)

## 2022-03-30 LAB — LIPID PANEL
Chol/HDL Ratio: 4.3 ratio (ref 0.0–4.4)
Cholesterol, Total: 219 mg/dL — ABNORMAL HIGH (ref 100–199)
HDL: 51 mg/dL (ref >39)
LDL Chol Calc (NIH): 140 mg/dL — ABNORMAL HIGH (ref 0–99)
Triglycerides: 157 mg/dL — ABNORMAL HIGH (ref 0–149)
VLDL Cholesterol Cal: 28 mg/dL (ref 5–40)

## 2022-03-30 LAB — HEMOGLOBIN A1C
Est. average glucose Bld gHb Est-mCnc: 111 mg/dL
Hgb A1c MFr Bld: 5.5 % (ref 4.8–5.6)

## 2022-06-19 LAB — HM COLONOSCOPY

## 2022-06-26 ENCOUNTER — Encounter: Payer: Self-pay | Admitting: Internal Medicine

## 2022-09-11 ENCOUNTER — Other Ambulatory Visit: Payer: Self-pay | Admitting: Internal Medicine

## 2022-09-11 DIAGNOSIS — I10 Essential (primary) hypertension: Secondary | ICD-10-CM

## 2022-09-12 NOTE — Telephone Encounter (Signed)
Patient moved to [place] she no a patient with dr berglund.
